# Patient Record
Sex: Male | Born: 1945 | Race: White | Hispanic: No | Smoking: Never smoker
Health system: Southern US, Community
[De-identification: ages and names within clinical notes are randomized; demographics above are authoritative.]

## PROBLEM LIST (undated history)

## (undated) DIAGNOSIS — I10 Essential (primary) hypertension: Secondary | ICD-10-CM

## (undated) HISTORY — DX: Essential (primary) hypertension: I10

## (undated) HISTORY — PX: HERNIA REPAIR: SHX51

## (undated) HISTORY — PX: TONSILLECTOMY: SUR1361

## (undated) HISTORY — PX: REPLACEMENT TOTAL KNEE: SUR1224

---

## 2005-11-10 ENCOUNTER — Ambulatory Visit: Payer: Self-pay | Admitting: Internal Medicine

## 2006-10-03 ENCOUNTER — Inpatient Hospital Stay (HOSPITAL_COMMUNITY): Admission: RE | Admit: 2006-10-03 | Discharge: 2006-10-06 | Payer: Self-pay | Admitting: Orthopedic Surgery

## 2008-07-17 ENCOUNTER — Encounter (INDEPENDENT_AMBULATORY_CARE_PROVIDER_SITE_OTHER): Payer: Self-pay | Admitting: *Deleted

## 2008-10-22 ENCOUNTER — Encounter (INDEPENDENT_AMBULATORY_CARE_PROVIDER_SITE_OTHER): Payer: Self-pay | Admitting: *Deleted

## 2009-02-13 ENCOUNTER — Emergency Department (HOSPITAL_COMMUNITY): Admission: EM | Admit: 2009-02-13 | Discharge: 2009-02-13 | Payer: Self-pay | Admitting: Emergency Medicine

## 2009-03-18 ENCOUNTER — Emergency Department (HOSPITAL_COMMUNITY): Admission: EM | Admit: 2009-03-18 | Discharge: 2009-03-18 | Payer: Self-pay | Admitting: Emergency Medicine

## 2009-08-12 ENCOUNTER — Encounter: Payer: Self-pay | Admitting: Internal Medicine

## 2009-08-26 ENCOUNTER — Encounter (INDEPENDENT_AMBULATORY_CARE_PROVIDER_SITE_OTHER): Payer: Self-pay | Admitting: *Deleted

## 2009-10-13 ENCOUNTER — Ambulatory Visit: Payer: Self-pay | Admitting: Internal Medicine

## 2009-10-13 DIAGNOSIS — N400 Enlarged prostate without lower urinary tract symptoms: Secondary | ICD-10-CM | POA: Insufficient documentation

## 2010-08-09 ENCOUNTER — Encounter: Payer: Self-pay | Admitting: Internal Medicine

## 2010-08-11 ENCOUNTER — Encounter: Payer: Self-pay | Admitting: Internal Medicine

## 2010-11-27 LAB — CONVERTED CEMR LAB
AST: 21 units/L (ref 0–37)
Alkaline Phosphatase: 78 units/L (ref 39–117)
Bilirubin, Direct: 0 mg/dL (ref 0.0–0.3)
CO2: 30 meq/L (ref 19–32)
Chloride: 103 meq/L (ref 96–112)
Eosinophils Absolute: 0.1 10*3/uL (ref 0.0–0.7)
GFR calc non Af Amer: 90.07 mL/min (ref >60)
Glucose, Bld: 78 mg/dL (ref 70–99)
HCT: 44.3 % (ref 39.0–52.0)
HDL: 54.2 mg/dL (ref >39.00)
Lymphs Abs: 1.3 10*3/uL (ref 0.7–4.0)
MCHC: 33.6 g/dL (ref 30.0–36.0)
MCV: 96.1 fL (ref 78.0–100.0)
Monocytes Absolute: 0.5 10*3/uL (ref 0.1–1.0)
Platelets: 226 10*3/uL (ref 150.0–400.0)
Potassium: 3.7 meq/L (ref 3.5–5.1)
RBC: 4.61 M/uL (ref 4.22–5.81)
Sodium: 141 meq/L (ref 135–145)
Triglycerides: 96 mg/dL (ref 0.0–149.0)
VLDL: 19.2 mg/dL (ref 0.0–40.0)
WBC: 5.5 10*3/uL (ref 4.5–10.5)

## 2010-11-30 NOTE — Miscellaneous (Signed)
Summary: Flu vacc documentation  Clinical Lists Changes  Observations: Added new observation of FLU VAX: Historical (08/09/2010 12:27)    Received notice from [facility] on [address] that patient did received flu vacc. Lucious Groves CMA  August 11, 2010 12:27 PM   Immunization History:  Influenza Immunization History:    Influenza:  historical (08/09/2010)

## 2010-11-30 NOTE — Miscellaneous (Signed)
Summary: Flu/[facility]  [facility]   Imported By: Lanelle Bal 08/18/2010 16:13:43  _____________________________________________________________________  External Attachment:    Type:   Image     Comment:   External Document

## 2011-02-07 LAB — COMPREHENSIVE METABOLIC PANEL
AST: 37 U/L (ref 0–37)
Albumin: 4.3 g/dL (ref 3.5–5.2)
Alkaline Phosphatase: 87 U/L (ref 39–117)
Calcium: 8.8 mg/dL (ref 8.4–10.5)
Creatinine, Ser: 1.24 mg/dL (ref 0.4–1.5)
GFR calc Af Amer: >60 mL/min (ref >60)
Potassium: 4.4 mEq/L (ref 3.5–5.1)
Total Protein: 6.9 g/dL (ref 6.0–8.3)

## 2011-02-07 LAB — CBC
HCT: 41.6 % (ref 39.0–52.0)
Hemoglobin: 14.7 g/dL (ref 13.0–17.0)
MCHC: 35.3 g/dL (ref 30.0–36.0)
MCV: 91.7 fL (ref 78.0–100.0)
RBC: 4.54 MIL/uL (ref 4.22–5.81)
WBC: 11.6 10*3/uL — ABNORMAL HIGH (ref 4.0–10.5)

## 2011-02-07 LAB — POCT URINALYSIS DIP (DEVICE)
Glucose, UA: NEGATIVE mg/dL
Ketones, ur: NEGATIVE mg/dL
Urobilinogen, UA: 1 mg/dL (ref 0.0–1.0)

## 2011-02-07 LAB — DIFFERENTIAL
Basophils Relative: 0 % (ref 0–1)
Eosinophils Relative: 0 % (ref 0–5)
Lymphocytes Relative: 5 % — ABNORMAL LOW (ref 12–46)
Monocytes Relative: 4 % (ref 3–12)
Neutro Abs: 10.5 10*3/uL — ABNORMAL HIGH (ref 1.7–7.7)

## 2011-02-07 LAB — LIPASE, BLOOD: Lipase: 20 U/L (ref 11–59)

## 2011-02-08 LAB — URINALYSIS, ROUTINE W REFLEX MICROSCOPIC
Glucose, UA: NEGATIVE mg/dL
Ketones, ur: >80 mg/dL — AB
Protein, ur: 30 mg/dL — AB
Specific Gravity, Urine: 1.028 (ref 1.005–1.030)
pH: 6 (ref 5.0–8.0)

## 2011-02-08 LAB — POCT I-STAT, CHEM 8
Creatinine, Ser: 1.1 mg/dL (ref 0.4–1.5)
Glucose, Bld: 106 mg/dL — ABNORMAL HIGH (ref 70–99)
HCT: 47 % (ref 39.0–52.0)
Hemoglobin: 16 g/dL (ref 13.0–17.0)
Sodium: 138 mEq/L (ref 135–145)

## 2011-02-08 LAB — CBC
Hemoglobin: 15.5 g/dL (ref 13.0–17.0)
MCHC: 34.3 g/dL (ref 30.0–36.0)
RBC: 4.87 MIL/uL (ref 4.22–5.81)

## 2011-02-08 LAB — DIFFERENTIAL
Eosinophils Absolute: 0 10*3/uL (ref 0.0–0.7)
Eosinophils Relative: 0 % (ref 0–5)
Lymphocytes Relative: 11 % — ABNORMAL LOW (ref 12–46)
Lymphs Abs: 1.2 10*3/uL (ref 0.7–4.0)
Monocytes Relative: 8 % (ref 3–12)

## 2011-03-17 NOTE — Op Note (Signed)
NAME:  Zachary Turner, PERSONS          ACCOUNT NO.:  [id number]   MEDICAL RECORD NO.:  [id number]          PATIENT TYPE:  INP   LOCATION:  NA                           [facility]ACILITY:  [facility]   PHYSICIAN:  Ollen Gross, M.D.    DATE OF BIRTH:  1945-10-31   DATE OF PROCEDURE:  10/03/2006  DATE OF DISCHARGE:                               OPERATIVE REPORT   PREOPERATIVE DIAGNOSIS:  Osteoarthritis right knee.   POSTOPERATIVE DIAGNOSIS:  Osteoarthritis right knee.   PROCEDURE:  Right total knee arthroplasty.   SURGEON:  Ollen Gross, M.D.   ASSISTANT:  Alexzandrew L. Perkins, P.A.-C.   ANESTHESIA:  Spinal.   ESTIMATED BLOOD LOSS:  Minimal.   DRAINS:  Hemovac x1.   TOURNIQUET TIME:  47 minutes at 300 mmHg.   COMPLICATIONS:  None.   CONDITION:  Stable to recovery.   BRIEF CLINICAL NOTE:  Grigor is a 65 year old male with significant end  stage medial compartment arthritis of the right knee.  He has had  previous arthroscopy, multiple injections including meniscal  supplements, and has failed nonoperative and arthroscopic management.  He has had intractable pain and presents now for total knee  arthroplasty.   PROCEDURE IN DETAIL:  After successful administration of spinal  anesthetic, a tourniquet was placed high on the right thigh and the  right lower extremity prepped and draped in the usual sterile fashion.  The extremity was wrapped in an Esmarch, knee flexed, tourniquet  inflated to 300 mmHg.  A midline incision was made with a 10 blade.  The  patient experienced pain and was subsequently converted to general via  LMA.  We then continued the procedure once the LMA was in place.  The  skin was cut with a 10 blade through the subcutaneous tissues to the  level of the extensor mechanism.  A fresh blade was used to make a  medial parapatellar arthrotomy.  Soft tissue over the proximal and  medial tibia is subperiosteally elevated to the joint line with the  knife and into the  semimembranosus bursa with a Cobb elevator.  The soft  tissue laterally is elevated with attention being paid to avoid the  patellar tendon on the tibial tubercle.  The patella was subluxed  laterally, knee flexed 90 degrees, ACL and PCL removed.  A drill was  used to create a starting hole in the distal femur, the canal was  thoroughly irrigated.  A 5 degree right valgus alignment guide is  placed, referencing off the posterior condyles, rotation is marked and  the block pinned to remove 11 mm off the distal femur.  We took 11  because of a slight flexion contracture.  Distal femoral resection is  made with an oscillating saw.  Sizing block is placed and size 5 is the  most appropriate.  The rotation is marked at the epicondylar axis and a  size 5 cutting block placed.  The anterior, posterior, and chamfer cuts  were subsequently made.   The tibia is subluxed forward and the menisci are removed.  Extramedullary tibial alignment guide is placed referencing proximally  at the medial aspect of  the tibial tubercle and distally along the  second metatarsal axis and tibial crest.  The block is pinned to remove  10 mm off the non-deficient lateral side.  Tibial resection is made with  an oscillating saw.  Size 5 is the most appropriate tibial component and  the proximal tibia is prepared with the modular drill and keel punch for  a size 4.  Femoral preparation is completed with the intercondylar cut  for the size 5.   Size 4 mobile bearing tibial trial with a size 5 posterior stabilized  femoral trial and a 10 mm posterior stabilized rotating platform insert  trial are placed.  With a 10, full extension was achieved with excellent  varus and valgus balance throughout full range of motion.  The patella  was everted, thickness measured to be 23 mm.  Freehand resection taken  to 12 mm.  The 41 template is placed, lug holes were drilled, trial  patella was placed and it tracks normally.   Osteophytes were removed off  the posterior femur with the trial placed.  All trials were removed and  the cut bone surfaces prepared with pulsatile lavage.  Cement was mixed  and once ready for implantation, the size 4 mobile bearing tibia, size 5  posterior stabilized femur and 41 patella are cemented into place and  the patella is held with a clamp.  Trial 10 mm inserts are placed and  the knee held in full extension and all extruded cement removed.  Once  the cement had fully hardened, then the permanent 10 mm posterior  stabilized rotating platform insert is placed into the tibial tray.  The  wound was copiously irrigated with saline solution and the extensor  mechanism closed over a Hemovac drain with interrupted #1 PDS.  Flexion  against gravity to 135 degrees.  The tourniquet is released with a total  time of 47 minutes.  The subcu is closed with interrupted 2-0 Vicryl and  subcuticular running 4-0 Monocryl.  The incision is cleaned and dried  and Steri-Strips and a bulky sterile dressing applied.  He is  subsequently awakened and transferred to recovery in stable condition.      Ollen Gross, M.D.  Electronically Signed     FA/MEDQ  D:  10/03/2006  T:  10/04/2006  Job:  [id number]

## 2011-03-17 NOTE — H&P (Signed)
NAME:  Zachary Turner, NORDIN NO.:  [id number]   MEDICAL RECORD NO.:  [id number]          PATIENT TYPE:  INP   LOCATION:  NA                           FACILITY:  [facility]   PHYSICIAN:  Ollen Gross, M.D.    DATE OF BIRTH:  1945-12-06   DATE OF ADMISSION:  10/03/2006  DATE OF DISCHARGE:                              HISTORY & PHYSICAL   DATE OF OFFICE VISIT/HISTORY AND PHYSICAL:  September 25, 2006.   DATE OF ADMISSION:  October 03, 2006.   CHIEF COMPLAINT:  Right knee pain.   HISTORY OF PRESENT ILLNESS:  Patient is a 65 year old male who has been  seen in consultation by Dr. Trudee Grip for ongoing right knee.  He has  been previously been treated by Dr. Hayden Rasmussen with ongoing right knee  problems.  He has known significant arthritis in the right knee with  near bone-on-bone with follow-up x-rays earlier this past month, where  it has collapsed down now to bone-on-bone in the medial compartment.  He  has had progressive symptoms in the past.  He is good friends with Dr.  Charlies Constable and was referred over.  He is seen by Dr. Despina Hick, found to  have significant arthritic changes in the medial compartment.  Due to  his pain level and his desire to continue his active lifestyle, it is  felt he would benefit from undergoing total knee replacement.  Risks and  benefits discussed.  Patient subsequently admitted to the hospital.   ALLERGIES:  No known drug allergies.   CURRENT MEDICATIONS:  Potassium gluconate, and also multivitamins.   PAST MEDICAL HISTORY:  Arthritis.   PAST SURGICAL HISTORY:  Right knee arthroscopy, osteoma surgery, wisdom  teeth extraction, tonsillectomy and adenoidectomy, and right groin  hernia repair.   SOCIAL HISTORY:  Married.  Works as an Scientist, research (medical).  Nonsmoker.  Occasional intake of alcohol.  Six children.  His wife will be assisting  with care after surgery.   FAMILY HISTORY:  Father deceased, age 49, with hypertension.  Mother  still living, age 33, with hypertension.   REVIEW OF SYSTEMS:  GENERAL:  No fevers, chills, night sweats.  NEURO:  No seizures, syncope, paralysis.  RESPIRATORY:  No shortness of breath,  productive cough, or hemoptysis.  CARDIOVASCULAR:  No chest pain,  angina, or orthopnea.  GI:  No nausea, vomiting, diarrhea, or  constipation.  GU:  No dysuria, hematuria, or discharge.  MUSCULOSKELETAL:  Right knee.   PHYSICAL EXAMINATION:  VITAL SIGNS:  Pulse 72, respirations 14, blood  pressure 128/70.  GENERAL:  A 65 year old white male who is well-developed and well-  nourished in no acute distress.  Alert, oriented and cooperative.  Very  pleasant.  An excellent historian.  HEENT:  Normocephalic and atraumatic.  Pupils are round and reactive.  Oropharynx is clear.  EOMs intact.  NECK:  Supple.  CHEST:  Clear.  HEART:  Regular rate and rhythm without murmur.  ABDOMEN:  Soft, flat, nontender.  Bowel sounds present.  RECTAL/BREASTS/GENITALIA:  Not done.  Not pertinent to the present  illness.  EXTREMITIES:  Right knee:  No effusion.  Slightly antalgic gait.  Moderate crepitus.  Range of motion is 0-130.  Tender medial joint line.   IMPRESSION:  Osteoarthritis, right knee.   PLAN:  Patient is admitted to [facility] to undergo right  total knee replacement arthroplasty.  Surgery will be performed by Dr.  Trudee Grip.      Alexzandrew L. Julien Girt, P.A.      Ollen Gross, M.D.  Electronically Signed    ALP/MEDQ  D:  10/02/2006  T:  10/03/2006  Job:  [id number]   cc:   Titus Dubin. Alwyn Ren, MD,FACP,FCCP  [phone] [place]  [place] [address]

## 2011-03-17 NOTE — Discharge Summary (Signed)
NAME:  Zachary Turner, Zachary Turner          ACCOUNT NO.:  [id number]   MEDICAL RECORD NO.:  [id number]          PATIENT TYPE:  INP   LOCATION:  1605                         FACILITY:  [facility]   PHYSICIAN:  Ollen Gross, M.D.    DATE OF BIRTH:  10/08/1946   DATE OF ADMISSION:  10/03/2006  DATE OF DISCHARGE:  10/06/2006                               DISCHARGE SUMMARY   ADMITTING DIAGNOSIS:  Osteoarthritis, right knee.   DISCHARGE DIAGNOSES:  1. Osteoarthritis, right knee, status post right total knee      arthroplasty.  2. Acute blood loss anemia, did not require transfusion.   PROCEDURE:  10/03/2006, right total knee.   SURGEON:  Ollen Gross, M.D.   ASSISTANT:  Alexzandrew L. Julien Girt, PAC.   ANESTHESIA:  Spinal.   TOURNIQUET TIME:  47 minutes.   CONSULTS:  None.   BRIEF HISTORY:  Zachary Turner is a 65 year old male with significant end-stage  medial compartment arthritis of the right knee, previous arthroscopy,  multiple injections and including meniscal supplements, failed  nonoperative management and arthroscopic management, intractable pain,  now presents for total knee.   LABORATORY DATA:  Preoperative CBC, hemoglobin 14.6, hematocrit 42.8,  white cell count 5.7.  Postoperative hemoglobin 11.5.  Last  H & H 9.3  and 26.4.  PT and PTT preoperative 13.1 and 31 respectively.  INR 1.0  __________  PT INR 25.9 and 2.2.  Chem panel on admission all within  normal limits.  __________.  Electrolytes remained within normal limits.  Glucose went up to 139 back down to 123.  Preoperative UA negative.  Blood group type A Positive.   EKG 09/26/2006 normal sinus rhythm, normal EKG, __________ admission.   HOSPITAL COURSE:  The patient admitted to [facility],  tolerated the procedure well, and later transferred to the recovery room  and then to floor.  Started on PC and p.o. analgesics for  pain control.  Given  24 hours postoperative IV antibiotics, started on Coumadin for  DVT  prophylaxis, to have a little bit of nausea on the night of surgery  and early hours, but about the next morning the nausea had improved.  He  was doing pretty well with his pain control, using his PCA a little bit,  had good urinary output, started to get up out of bed with physical  therapy.  Hemovac drain was pulled.  By day-2, he was doing absolutely  fantastic, he has already been at 90 degrees with his knee, starting to  get up with physical therapy and he was walking 200 feet, later 400 feet  that afternoon, he was doing task.  Dressing was changed on day-2 and  the incision looked excellent, and the patient was ready to go home by  the following day.   DISCHARGE PLAN:  1. The patient discharged home on 10/06/2006.  2. Discharge diagnosis, please see above.  3. Discharge medications:  Coumadin, Percocet, Robaxin.  4. Diet, as tolerated.  5. Follow up in 2 weeks.  6. Activity, weight-bearing as tolerated.  7. Home health PT, Home health nursing protocol.   DISPOSITION:  Home.   CONDITION  UPON DISCHARGE:  Improved.      Alexzandrew L. Julien Girt, P.A.      Ollen Gross, M.D.  Electronically Signed    ALP/MEDQ  D:  10/24/2006  T:  10/24/2006  Job:  [id number]   cc:   Ollen Gross, M.D.  Fax: [phone]   Titus Dubin. Alwyn Ren, MD,FACP,FCCP  [phone] [place]  [place] [address]

## 2011-08-07 ENCOUNTER — Other Ambulatory Visit: Payer: Self-pay

## 2011-08-07 MED ORDER — PNEUMOCOCCAL VAC POLYVALENT 25 MCG/0.5ML IJ INJ: 0.5000 mL | INJECTION | Freq: Once | INTRAMUSCULAR | Status: DC

## 2011-08-07 MED ORDER — ZOSTER VACCINE LIVE 19400 UNT/0.65ML ~~LOC~~ SOLR: 0.6500 mL | Freq: Once | SUBCUTANEOUS | Status: DC

## 2011-08-07 NOTE — Telephone Encounter (Signed)
Patient aware rx sent in  

## 2012-04-30 ENCOUNTER — Other Ambulatory Visit: Payer: Self-pay | Admitting: Internal Medicine

## 2012-12-17 ENCOUNTER — Encounter (HOSPITAL_COMMUNITY): Payer: Self-pay | Admitting: Emergency Medicine

## 2012-12-17 ENCOUNTER — Emergency Department (HOSPITAL_COMMUNITY)
Admission: EM | Admit: 2012-12-17 | Discharge: 2012-12-17 | Disposition: A | Discharge status: Home / Self Care | Payer: Medicare HMO | Attending: Emergency Medicine | Admitting: Emergency Medicine

## 2012-12-17 ENCOUNTER — Emergency Department (HOSPITAL_COMMUNITY): Payer: Medicare HMO

## 2012-12-17 DIAGNOSIS — Y9373 Activity, racquet and hand sports: Secondary | ICD-10-CM | POA: Insufficient documentation

## 2012-12-17 DIAGNOSIS — S43109A Unspecified dislocation of unspecified acromioclavicular joint, initial encounter: Secondary | ICD-10-CM | POA: Insufficient documentation

## 2012-12-17 DIAGNOSIS — Y92838 Other recreation area as the place of occurrence of the external cause: Secondary | ICD-10-CM | POA: Insufficient documentation

## 2012-12-17 DIAGNOSIS — Y9239 Other specified sports and athletic area as the place of occurrence of the external cause: Secondary | ICD-10-CM | POA: Insufficient documentation

## 2012-12-17 DIAGNOSIS — W010XXA Fall on same level from slipping, tripping and stumbling without subsequent striking against object, initial encounter: Secondary | ICD-10-CM | POA: Insufficient documentation

## 2012-12-17 DIAGNOSIS — S43006A Unspecified dislocation of unspecified shoulder joint, initial encounter: Secondary | ICD-10-CM

## 2012-12-17 MED ORDER — HYDROCODONE-ACETAMINOPHEN 5-325 MG PO TABS: 2.0000 | ORAL_TABLET | ORAL | Status: DC | PRN

## 2012-12-17 NOTE — ED Provider Notes (Signed)
History     CSN: [id number]  Arrival date & time 12/17/12  5621   First MD Initiated Contact with Patient 12/17/12 2101      Chief Complaint  Patient presents with  . Shoulder Injury    (Consider location/radiation/quality/duration/timing/severity/associated sxs/prior treatment) HPI Comments: Patient comes to the ER for evaluation of left shoulder injury. Patient reports that he tripped while playing tennis, fell to the ground landing on his left shoulder. Patient is complaining of pain in the shoulder since the fall. There was no head injury or loss of consciousness. No neck or back pain. Patient reports only mild pain if he sits still, but if he tries to move the arm there is severe pain.  Patient is a 67 y.o. male presenting with shoulder injury.  Shoulder Injury Pertinent negatives include no headaches.    History reviewed. No pertinent past medical history.  Past Surgical History  Procedure Laterality Date  . Hernia repair    . Replacement total knee    . Tonsillectomy      History reviewed. No pertinent family history.  History  Substance Use Topics  . Smoking status: Never Smoker   . Smokeless tobacco: Not on file  . Alcohol Use: Yes     Comment: Socially      Review of Systems  Musculoskeletal: Negative for back pain.  Neurological: Negative for headaches.    Allergies  Review of patient's allergies indicates no known allergies.  Home Medications   Current Outpatient Rx  Name  Route  Sig  Dispense  Refill  . PNEUMOVAX 23 25 MCG/0.5ML injection      INJECT 0.5 ML INTO THE MUSCLE AT ONCE   0.5 mL   0   . zoster vaccine live, PF, (ZOSTAVAX) 30865 UNT/0.65ML injection   Subcutaneous   Inject 19,400 Units into the skin once.   1 vial   0     BP 139/81  Pulse 79  Temp(Src) 98 F (36.7 C) (Oral)  Resp 18  SpO2 99%  Physical Exam  Constitutional: He is oriented to person, place, and time. He appears well-developed and well-nourished. No  distress.  HENT:  Head: Normocephalic and atraumatic.  Right Ear: Hearing normal.  Nose: Nose normal.  Mouth/Throat: Oropharynx is clear and moist and mucous membranes are normal.  Eyes: Conjunctivae and EOM are normal. Pupils are equal, round, and reactive to light.  Neck: Normal range of motion. Neck supple.  Cardiovascular: Normal rate, regular rhythm, S1 normal and S2 normal.  Exam reveals no gallop and no friction rub.   No murmur heard. Pulmonary/Chest: Effort normal and breath sounds normal. No respiratory distress. He exhibits no tenderness.  Abdominal: Soft. Normal appearance and bowel sounds are normal. There is no hepatosplenomegaly. There is no tenderness. There is no rebound, no guarding, no tenderness at McBurney's point and negative Murphy's sign. No hernia.  Musculoskeletal:       Left shoulder: He exhibits decreased range of motion, tenderness, bony tenderness, swelling and deformity.       Arms: Neurological: He is alert and oriented to person, place, and time. He has normal strength. No cranial nerve deficit or sensory deficit. Coordination normal. GCS eye subscore is 4. GCS verbal subscore is 5. GCS motor subscore is 6.  Skin: Skin is warm, dry and intact. No rash noted. No cyanosis.  Psychiatric: He has a normal mood and affect. His speech is normal and behavior is normal. Thought content normal.    ED Course  Procedures (including critical care time)  Labs Reviewed - No data to display Dg Shoulder Left  12/17/2012  *RADIOLOGY REPORT*  Clinical Data: Fall  LEFT SHOULDER - 2+ VIEW  Comparison: None.  Findings: There is near complete superior displacement of the peripheral end of the clavicle with respect to the acromion.  The coracoclavicular distance is within normal limits.  Anatomic alignment of the humeral head with respect to the glenoid.  No fracture.  Chronic changes in the superior lateral humeral head. 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  IMPRESSION: Grade  II AC joint separation.  Coracoclavicular ligament is intact.   Original Report Authenticated By: Jolaine Click, M.D.      Diagnoses: Separated shoulder    MDM  Patient presents to the ER with complaints of left shoulder pain after a fall. X-ray shows grade 2 a.c. joint separation. Patient to be treated with a sling and analgesia. Follow up with orthopedics.        Gilda Crease, MD 12/17/12 2120

## 2012-12-17 NOTE — ED Notes (Signed)
Patient complaining of left shoulder dislocation; patient reports that he was playing tennis today when he fell on his left side.  Denies hitting head or passing out upon falling down.  Shoulders notably uneven.

## 2012-12-17 NOTE — Progress Notes (Signed)
Orthopedic Tech Progress Note Patient Details:  Zachary Turner 05/02/1946 [id number]  Ortho Devices Type of Ortho Device: Shoulder immobilizer Ortho Device/Splint Location: (L) UE Ortho Device/Splint Interventions: Application   Jennye Moccasin 12/17/2012, 9:31 PM

## 2013-04-05 IMAGING — CR DG SHOULDER 2+V*L*
3 series · 3 of 3 positions shown · non-contrast
Comparison: None.

CLINICAL DATA: Fall

LEFT SHOULDER - 2+ VIEW

[w shoulder ap internal left]
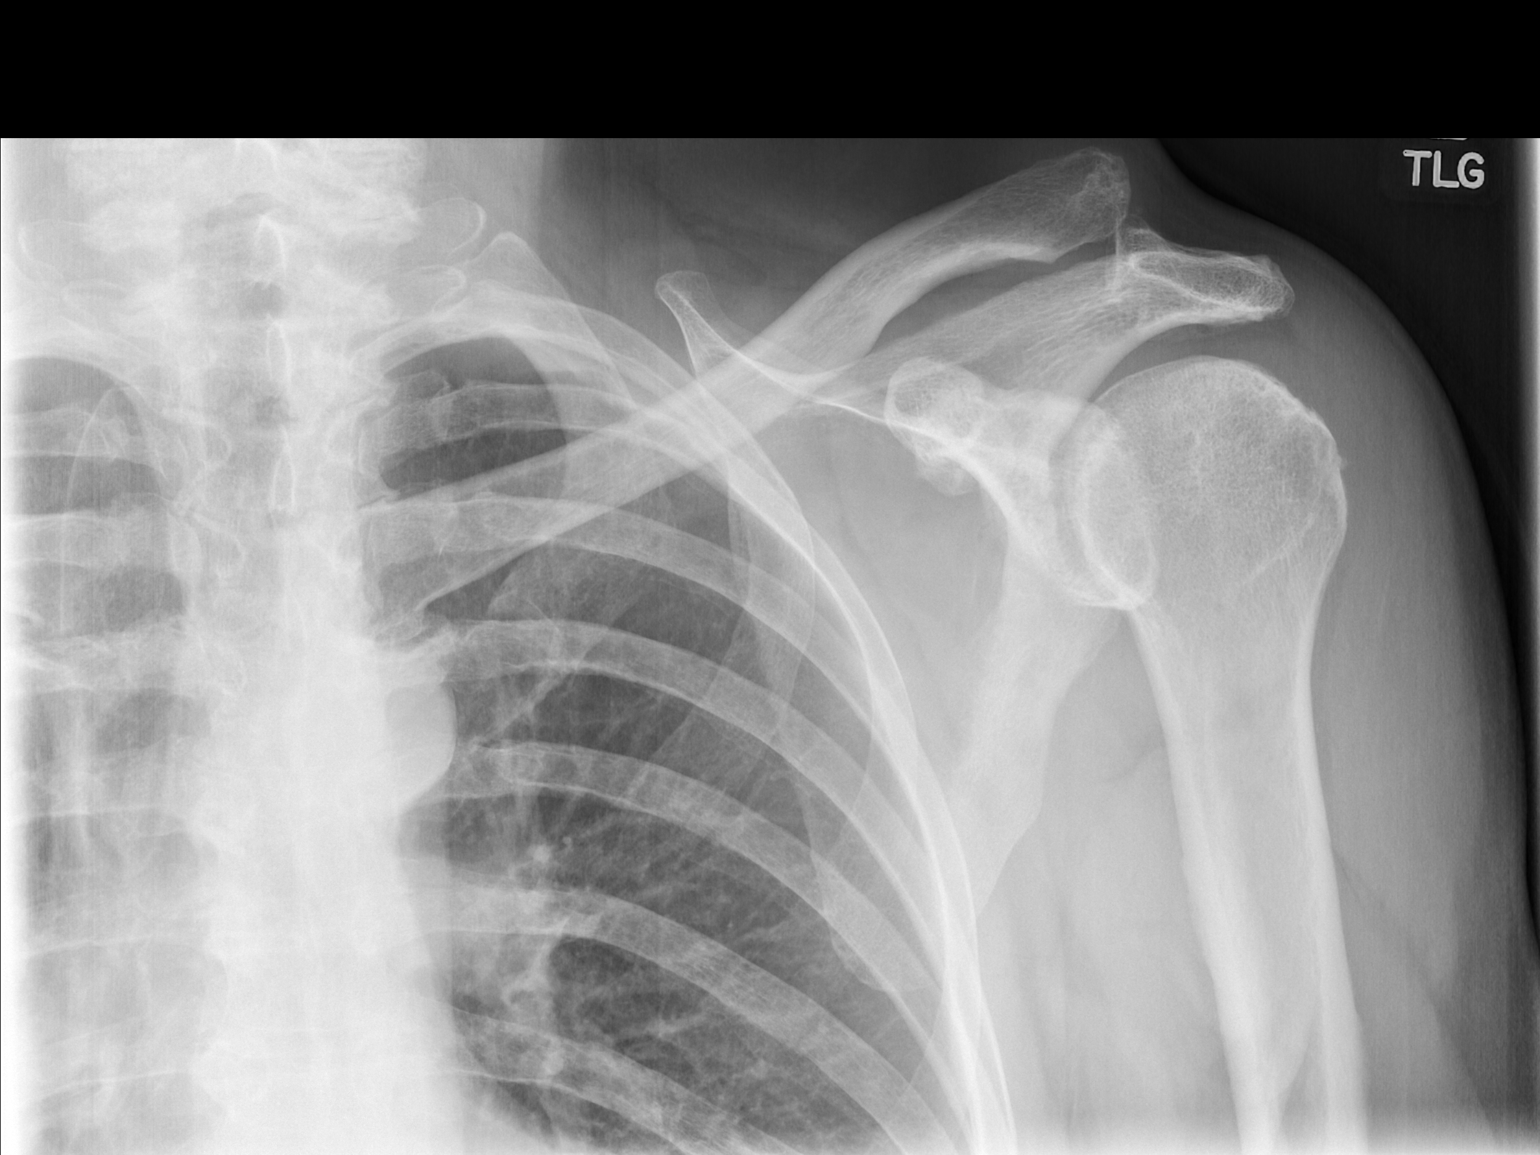

[w shoulder ap external left]
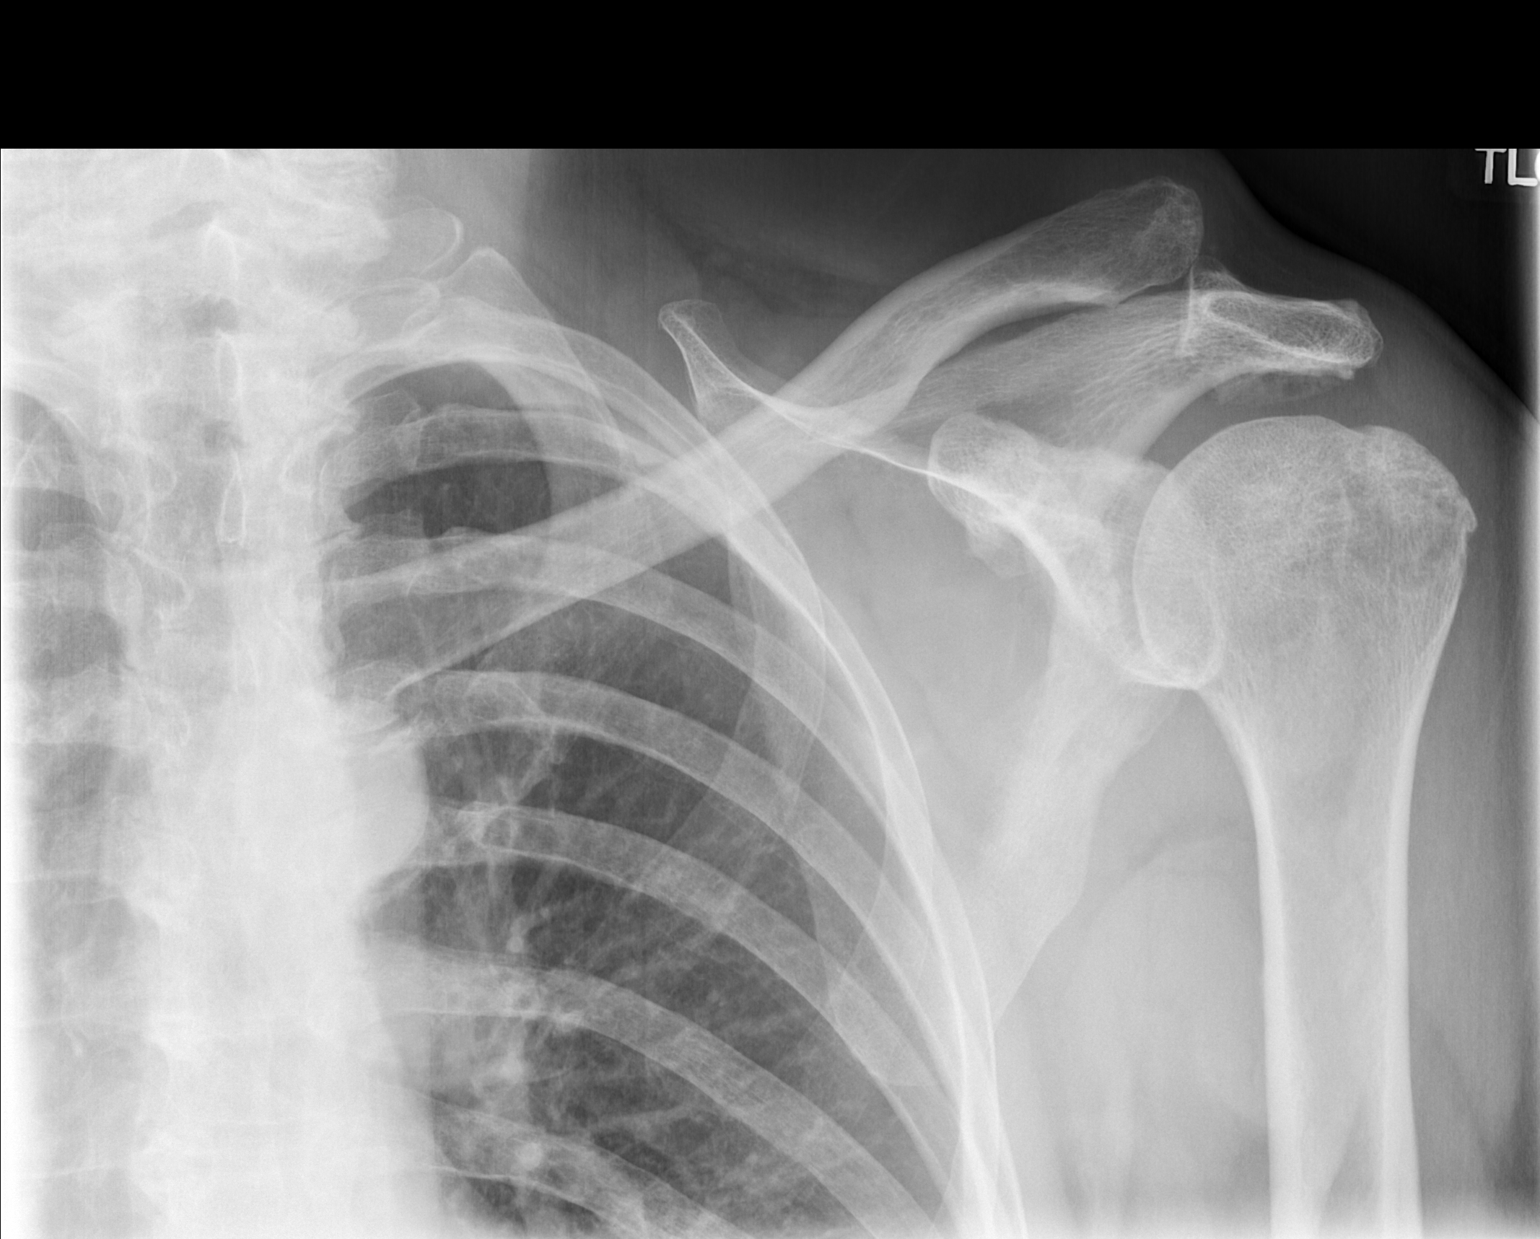

[w shoulder y view left]
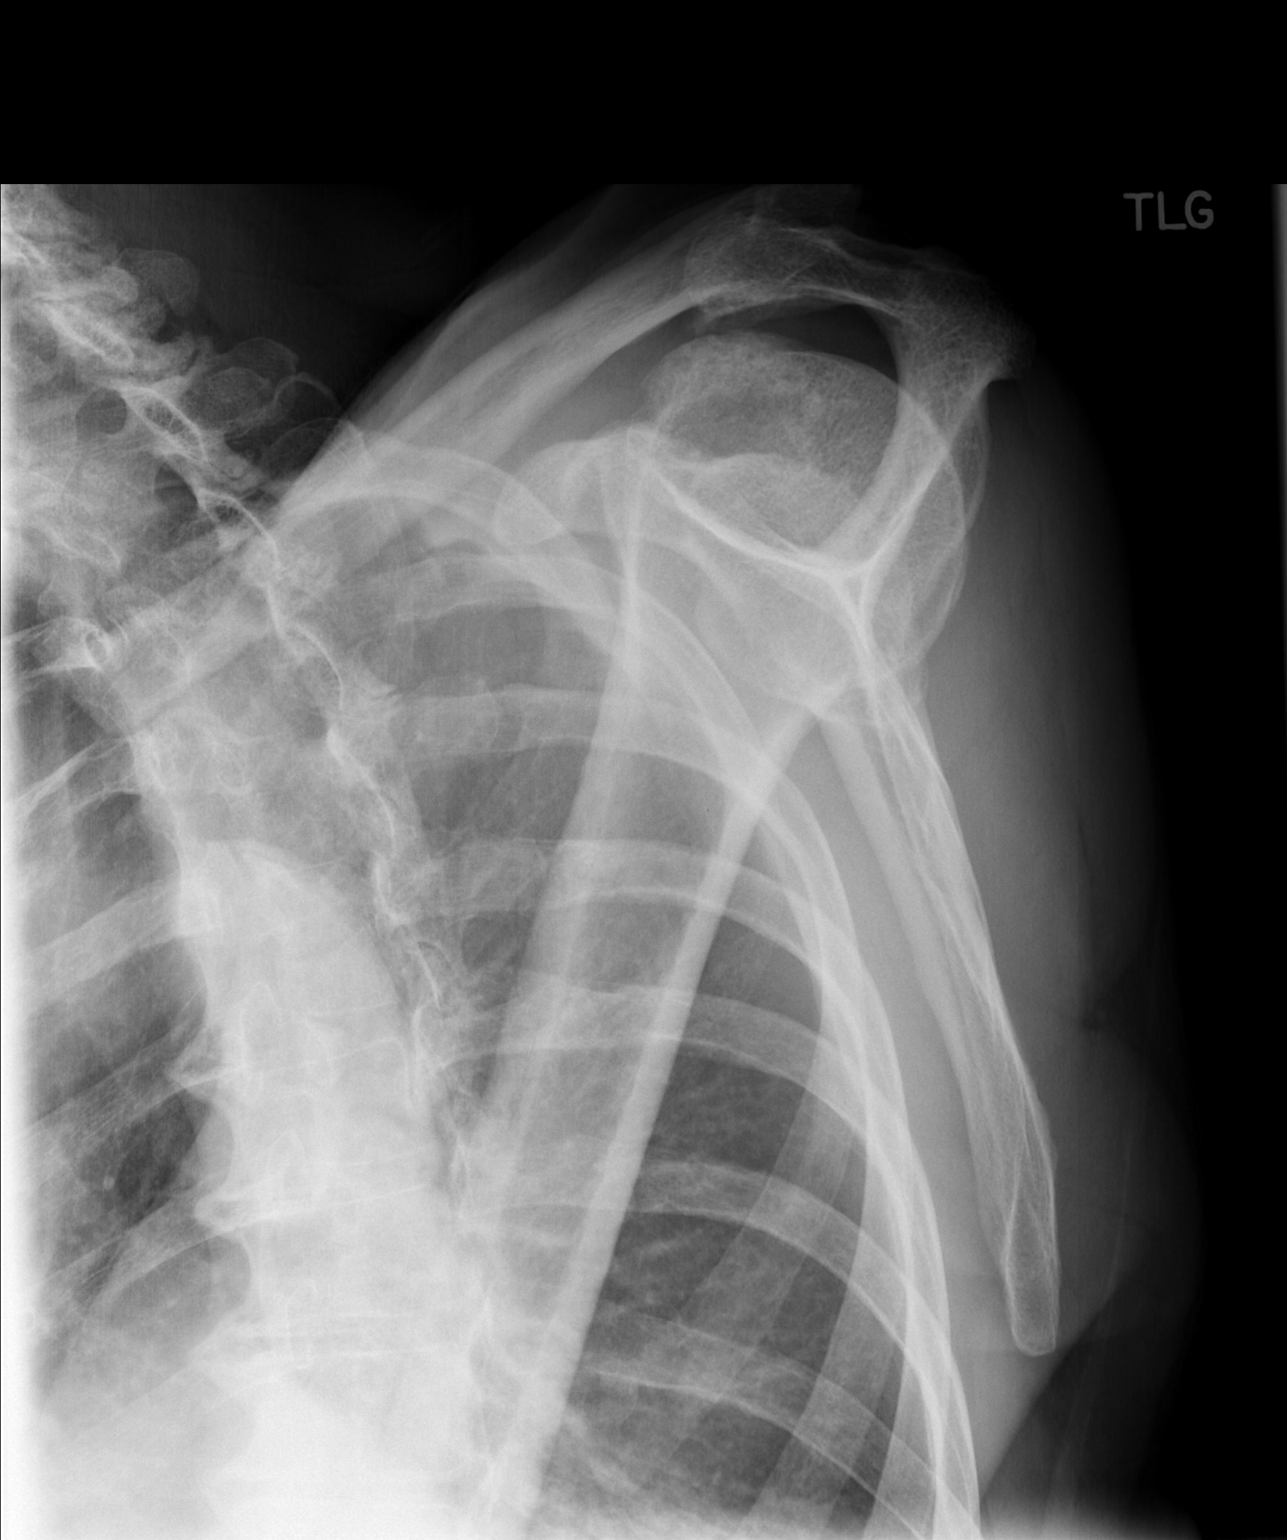

[3 of 3 positions shown; findings below may reference images not displayed]

FINDINGS: There is near complete superior displacement of the
peripheral end of the clavicle with respect to the acromion.  The
coracoclavicular distance is within normal limits.  Anatomic
alignment of the humeral head with respect to the glenoid.  No
fracture.  Chronic changes in the superior lateral humeral head.
Azygos lobe noted in the right upper lobe.
IMPRESSION: Grade II AC joint separation.  Coracoclavicular ligament is intact.

## 2016-04-04 DIAGNOSIS — L812 Freckles: Secondary | ICD-10-CM | POA: Diagnosis not present

## 2016-04-04 DIAGNOSIS — L821 Other seborrheic keratosis: Secondary | ICD-10-CM | POA: Diagnosis not present

## 2016-04-04 DIAGNOSIS — D225 Melanocytic nevi of trunk: Secondary | ICD-10-CM | POA: Diagnosis not present

## 2016-04-04 DIAGNOSIS — D485 Neoplasm of uncertain behavior of skin: Secondary | ICD-10-CM | POA: Diagnosis not present

## 2016-04-04 DIAGNOSIS — L814 Other melanin hyperpigmentation: Secondary | ICD-10-CM | POA: Diagnosis not present

## 2016-04-04 DIAGNOSIS — D1801 Hemangioma of skin and subcutaneous tissue: Secondary | ICD-10-CM | POA: Diagnosis not present

## 2016-04-04 DIAGNOSIS — L578 Other skin changes due to chronic exposure to nonionizing radiation: Secondary | ICD-10-CM | POA: Diagnosis not present

## 2016-04-04 DIAGNOSIS — Z85828 Personal history of other malignant neoplasm of skin: Secondary | ICD-10-CM | POA: Diagnosis not present

## 2016-05-11 DIAGNOSIS — L820 Inflamed seborrheic keratosis: Secondary | ICD-10-CM | POA: Diagnosis not present

## 2016-05-11 DIAGNOSIS — B353 Tinea pedis: Secondary | ICD-10-CM | POA: Diagnosis not present

## 2016-05-11 DIAGNOSIS — Z85828 Personal history of other malignant neoplasm of skin: Secondary | ICD-10-CM | POA: Diagnosis not present

## 2016-07-13 DIAGNOSIS — J0100 Acute maxillary sinusitis, unspecified: Secondary | ICD-10-CM | POA: Diagnosis not present

## 2016-07-31 DIAGNOSIS — R69 Illness, unspecified: Secondary | ICD-10-CM | POA: Diagnosis not present

## 2016-10-12 DIAGNOSIS — L812 Freckles: Secondary | ICD-10-CM | POA: Diagnosis not present

## 2016-10-12 DIAGNOSIS — L853 Xerosis cutis: Secondary | ICD-10-CM | POA: Diagnosis not present

## 2016-10-12 DIAGNOSIS — Z85828 Personal history of other malignant neoplasm of skin: Secondary | ICD-10-CM | POA: Diagnosis not present

## 2016-10-12 DIAGNOSIS — D1801 Hemangioma of skin and subcutaneous tissue: Secondary | ICD-10-CM | POA: Diagnosis not present

## 2016-10-12 DIAGNOSIS — D225 Melanocytic nevi of trunk: Secondary | ICD-10-CM | POA: Diagnosis not present

## 2016-10-12 DIAGNOSIS — L821 Other seborrheic keratosis: Secondary | ICD-10-CM | POA: Diagnosis not present

## 2017-02-08 DIAGNOSIS — B078 Other viral warts: Secondary | ICD-10-CM | POA: Diagnosis not present

## 2017-02-08 DIAGNOSIS — Z85828 Personal history of other malignant neoplasm of skin: Secondary | ICD-10-CM | POA: Diagnosis not present

## 2017-04-24 DIAGNOSIS — L738 Other specified follicular disorders: Secondary | ICD-10-CM | POA: Diagnosis not present

## 2017-04-24 DIAGNOSIS — Z85828 Personal history of other malignant neoplasm of skin: Secondary | ICD-10-CM | POA: Diagnosis not present

## 2017-04-24 DIAGNOSIS — D225 Melanocytic nevi of trunk: Secondary | ICD-10-CM | POA: Diagnosis not present

## 2017-04-24 DIAGNOSIS — L812 Freckles: Secondary | ICD-10-CM | POA: Diagnosis not present

## 2017-04-24 DIAGNOSIS — D2261 Melanocytic nevi of right upper limb, including shoulder: Secondary | ICD-10-CM | POA: Diagnosis not present

## 2017-04-24 DIAGNOSIS — D1801 Hemangioma of skin and subcutaneous tissue: Secondary | ICD-10-CM | POA: Diagnosis not present

## 2017-04-24 DIAGNOSIS — L239 Allergic contact dermatitis, unspecified cause: Secondary | ICD-10-CM | POA: Diagnosis not present

## 2017-04-24 DIAGNOSIS — B353 Tinea pedis: Secondary | ICD-10-CM | POA: Diagnosis not present

## 2017-04-24 DIAGNOSIS — D2271 Melanocytic nevi of right lower limb, including hip: Secondary | ICD-10-CM | POA: Diagnosis not present

## 2017-04-24 DIAGNOSIS — L821 Other seborrheic keratosis: Secondary | ICD-10-CM | POA: Diagnosis not present

## 2017-07-26 DIAGNOSIS — R69 Illness, unspecified: Secondary | ICD-10-CM | POA: Diagnosis not present

## 2017-10-09 DIAGNOSIS — C44612 Basal cell carcinoma of skin of right upper limb, including shoulder: Secondary | ICD-10-CM | POA: Diagnosis not present

## 2017-10-09 DIAGNOSIS — L812 Freckles: Secondary | ICD-10-CM | POA: Diagnosis not present

## 2017-10-09 DIAGNOSIS — D485 Neoplasm of uncertain behavior of skin: Secondary | ICD-10-CM | POA: Diagnosis not present

## 2017-10-09 DIAGNOSIS — L821 Other seborrheic keratosis: Secondary | ICD-10-CM | POA: Diagnosis not present

## 2017-10-09 DIAGNOSIS — D1801 Hemangioma of skin and subcutaneous tissue: Secondary | ICD-10-CM | POA: Diagnosis not present

## 2017-10-09 DIAGNOSIS — Z85828 Personal history of other malignant neoplasm of skin: Secondary | ICD-10-CM | POA: Diagnosis not present

## 2017-11-02 DIAGNOSIS — Z85828 Personal history of other malignant neoplasm of skin: Secondary | ICD-10-CM | POA: Diagnosis not present

## 2017-11-02 DIAGNOSIS — L821 Other seborrheic keratosis: Secondary | ICD-10-CM | POA: Diagnosis not present

## 2017-11-15 ENCOUNTER — Ambulatory Visit: Payer: Medicare HMO | Admitting: Sports Medicine

## 2017-11-15 ENCOUNTER — Ambulatory Visit: Payer: Self-pay

## 2017-11-15 ENCOUNTER — Encounter: Payer: Self-pay | Admitting: Sports Medicine

## 2017-11-15 VITALS — BP 181/93 | Ht 72.0 in | Wt 167.0 lb

## 2017-11-15 DIAGNOSIS — M25512 Pain in left shoulder: Secondary | ICD-10-CM

## 2017-11-15 DIAGNOSIS — M19012 Primary osteoarthritis, left shoulder: Secondary | ICD-10-CM | POA: Diagnosis not present

## 2017-11-15 DIAGNOSIS — M19011 Primary osteoarthritis, right shoulder: Secondary | ICD-10-CM | POA: Insufficient documentation

## 2017-11-15 DIAGNOSIS — M75112 Incomplete rotator cuff tear or rupture of left shoulder, not specified as traumatic: Secondary | ICD-10-CM | POA: Insufficient documentation

## 2017-11-15 MED ORDER — NITROGLYCERIN 0.2 MG/HR TD PT24: MEDICATED_PATCH | TRANSDERMAL | 0 refills | Status: DC
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
## 2017-11-15 NOTE — Assessment & Plan Note (Addendum)
Patient here w/ sxs c/w left sided RC tear. [place] gave evidence of GH irregularity and supraspinatous tear/calcification. 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  - HEP given - Nitro protocol - F/u 6 weeks

## 2017-11-15 NOTE — Patient Instructions (Signed)
Nitroglycerin Protocol   Apply 1/4 nitroglycerin patch to affected area daily.  Change position of patch within the affected area every 24 hours.  You may experience a headache during the first 1-2 weeks of using the patch, these should subside.  If you experience headaches after beginning nitroglycerin patch treatment, you may take your preferred over the counter pain reliever.  Another side effect of the nitroglycerin patch is skin irritation or rash related to patch adhesive.  Please notify our office if you develop more severe headaches or rash, and stop the patch.  Tendon healing with nitroglycerin patch may require 12 to 24 weeks depending on the extent of injury.  Men should not use if taking Viagra, Cialis, or Levitra.   Do not use if you have migraines or rosacea.   Exercises:  -Perform the physical therapy exercises provided today, with both shoulders, every day over the next 6 weeks. -In addition, for only your left arm perform light, low velocity and controlled (1) forehand, (2) back hand, and (3) serving motions with a 2-3 pound weight.  15 x 3.

## 2017-11-15 NOTE — Assessment & Plan Note (Signed)
As above.

## 2017-11-22 ENCOUNTER — Ambulatory Visit: Payer: Medicare HMO | Admitting: Sports Medicine

## 2017-12-26 ENCOUNTER — Ambulatory Visit: Payer: Self-pay

## 2017-12-26 ENCOUNTER — Encounter: Payer: Self-pay | Admitting: Sports Medicine

## 2017-12-26 ENCOUNTER — Ambulatory Visit: Payer: Medicare HMO | Admitting: Sports Medicine

## 2017-12-26 VITALS — BP 140/84 | Ht 71.5 in | Wt 167.0 lb

## 2017-12-26 DIAGNOSIS — M19012 Primary osteoarthritis, left shoulder: Secondary | ICD-10-CM

## 2017-12-26 DIAGNOSIS — M75112 Incomplete rotator cuff tear or rupture of left shoulder, not specified as traumatic: Secondary | ICD-10-CM

## 2017-12-26 DIAGNOSIS — M7521 Bicipital tendinitis, right shoulder: Secondary | ICD-10-CM | POA: Diagnosis not present

## 2017-12-26 DIAGNOSIS — M19011 Primary osteoarthritis, right shoulder: Secondary | ICD-10-CM | POA: Diagnosis not present

## 2017-12-26 NOTE — Assessment & Plan Note (Signed)
Continue to work ROM Keep up RC exercises PRN ibuprofen

## 2017-12-26 NOTE — Assessment & Plan Note (Signed)
Add biceps curls and forearm rolls  Try NTG protocol
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
## 2017-12-26 NOTE — Assessment & Plan Note (Signed)
Clinically much improved Appearance on [place] is improved as well  Cont spokes of wheel exercises NTG protocol for another 2 mos Reck 2 mos

## 2017-12-27 ENCOUNTER — Ambulatory Visit: Payer: Medicare HMO | Admitting: Sports Medicine

## 2018-01-14 DIAGNOSIS — R05 Cough: Secondary | ICD-10-CM | POA: Diagnosis not present

## 2018-01-14 DIAGNOSIS — J00 Acute nasopharyngitis [common cold]: Secondary | ICD-10-CM | POA: Diagnosis not present

## 2018-01-25 ENCOUNTER — Other Ambulatory Visit: Payer: Self-pay | Admitting: *Deleted

## 2018-01-25 MED ORDER — NITROGLYCERIN 0.2 MG/HR TD PT24: MEDICATED_PATCH | TRANSDERMAL | 0 refills | Status: DC

## 2018-02-21 ENCOUNTER — Other Ambulatory Visit: Payer: Self-pay

## 2018-02-21 MED ORDER — NITROGLYCERIN 0.2 MG/HR TD PT24: MEDICATED_PATCH | TRANSDERMAL | 0 refills | Status: DC

## 2018-03-05 ENCOUNTER — Ambulatory Visit: Payer: Medicare HMO | Admitting: Sports Medicine

## 2018-03-26 ENCOUNTER — Ambulatory Visit: Payer: Medicare HMO | Admitting: Sports Medicine

## 2018-03-26 ENCOUNTER — Encounter: Payer: Self-pay | Admitting: Sports Medicine

## 2018-03-26 VITALS — BP 148/88 | Ht 71.5 in | Wt 168.0 lb

## 2018-03-26 DIAGNOSIS — M25511 Pain in right shoulder: Secondary | ICD-10-CM

## 2018-03-26 DIAGNOSIS — M25512 Pain in left shoulder: Secondary | ICD-10-CM | POA: Diagnosis not present

## 2018-03-26 DIAGNOSIS — G8929 Other chronic pain: Secondary | ICD-10-CM | POA: Diagnosis not present

## 2018-03-26 MED ORDER — NITROGLYCERIN 0.2 MG/HR TD PT24: MEDICATED_PATCH | TRANSDERMAL | 1 refills | Status: DC
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
## 2018-04-12 DIAGNOSIS — D225 Melanocytic nevi of trunk: Secondary | ICD-10-CM | POA: Diagnosis not present

## 2018-04-12 DIAGNOSIS — L812 Freckles: Secondary | ICD-10-CM | POA: Diagnosis not present

## 2018-04-12 DIAGNOSIS — L821 Other seborrheic keratosis: Secondary | ICD-10-CM | POA: Diagnosis not present

## 2018-04-12 DIAGNOSIS — Z85828 Personal history of other malignant neoplasm of skin: Secondary | ICD-10-CM | POA: Diagnosis not present

## 2018-04-12 DIAGNOSIS — L304 Erythema intertrigo: Secondary | ICD-10-CM | POA: Diagnosis not present

## 2018-04-12 DIAGNOSIS — D485 Neoplasm of uncertain behavior of skin: Secondary | ICD-10-CM | POA: Diagnosis not present

## 2018-04-24 ENCOUNTER — Other Ambulatory Visit: Payer: Self-pay | Admitting: *Deleted

## 2018-04-24 MED ORDER — NITROGLYCERIN 0.2 MG/HR TD PT24: MEDICATED_PATCH | TRANSDERMAL | 1 refills | Status: DC

## 2018-05-17 ENCOUNTER — Other Ambulatory Visit: Payer: Self-pay | Admitting: *Deleted

## 2018-07-22 DIAGNOSIS — R69 Illness, unspecified: Secondary | ICD-10-CM | POA: Diagnosis not present

## 2018-10-11 DIAGNOSIS — L812 Freckles: Secondary | ICD-10-CM | POA: Diagnosis not present

## 2018-10-11 DIAGNOSIS — D225 Melanocytic nevi of trunk: Secondary | ICD-10-CM | POA: Diagnosis not present

## 2018-10-11 DIAGNOSIS — L570 Actinic keratosis: Secondary | ICD-10-CM | POA: Diagnosis not present

## 2018-10-11 DIAGNOSIS — Z85828 Personal history of other malignant neoplasm of skin: Secondary | ICD-10-CM | POA: Diagnosis not present

## 2018-10-11 DIAGNOSIS — L821 Other seborrheic keratosis: Secondary | ICD-10-CM | POA: Diagnosis not present

## 2018-10-11 DIAGNOSIS — L738 Other specified follicular disorders: Secondary | ICD-10-CM | POA: Diagnosis not present

## 2018-11-04 DIAGNOSIS — J300 Vasomotor rhinitis: Secondary | ICD-10-CM | POA: Diagnosis not present

## 2018-11-04 DIAGNOSIS — J329 Chronic sinusitis, unspecified: Secondary | ICD-10-CM | POA: Diagnosis not present

## 2019-01-16 ENCOUNTER — Telehealth: Payer: Self-pay | Admitting: Internal Medicine

## 2019-01-16 NOTE — Telephone Encounter (Signed)
Copied from [place] [id number]. Topic: General - Other >> Jan 16, 2019 12:28 PM Yvette Rack wrote: Reason for CRM: Pt wife Jayson Waterhouse is a patient of Dr. Quay Burow and she is requesting that Dr. Quay Burow agrees to become her husband's pcp. Pt wife requests call back. Cb# [phone]

## 2019-01-16 NOTE — Telephone Encounter (Signed)
Yes, I will accept him

## 2019-01-27 NOTE — Telephone Encounter (Signed)
LVM for patient tot call back and set appointment up in June. Call office for questions or concerns

## 2019-01-27 NOTE — Telephone Encounter (Signed)
Patient is scheduled for 5/1.  Patient is concerned about BP.  States it has been running around 145/90.  States he believes he will be fine until 5/1.  Denies having blurred/ spotty vision and headaches.  I did advise patient to call the office if things change.

## 2019-01-27 NOTE — Telephone Encounter (Signed)
Patient calling back for [facility] regarding voicemail left regarding scheduling an appointment with Dr. Quay Burow. Would like to schedule with her In June.

## 2019-01-27 NOTE — Telephone Encounter (Signed)
Wife called in and left message on vm , she would like a call back about getting pt in for a new pt appt.  Per TE Dr burns ok'd to take him on.  Please advise   [phone]

## 2019-01-27 NOTE — Telephone Encounter (Signed)
Pt returning Samantha's Vm regarding scheduling appt. Please advise.

## 2019-02-25 ENCOUNTER — Telehealth: Payer: Self-pay

## 2019-02-25 NOTE — Telephone Encounter (Signed)
Ok to schedule new pt virtual visit - we will discuss his BP -- in July we can do a CPE

## 2019-02-25 NOTE — Progress Notes (Signed)
Virtual Visit via Video Note  I connected with Zachary Turner on 02/26/19 at  9:30 AM EDT by a video enabled telemedicine application and verified that I am speaking with the correct person using two identifiers.   I discussed the limitations of evaluation and management by telemedicine and the availability of in person appointments. The patient expressed understanding and agreed to proceed.  The patient is currently at home and I am in the office.    No referring provider.    History of Present Illness: This visit is for him to establish care.  We planned on having him come in july, but he has been concerned about his BP being high and we needed to arrange for an acute visit.    BP at home over the past few days has ranged 140/80-160/95.  When he was younger his BP was 135/80 and has creeped up over the years.  He does not have a diagnosis of hypertension and is not on any medication for it.  Both of his parents had hypertension and was on medication.  He exercises daily and is not overweight.  He has a healthy diet, but it is not low is salt.  He drinks a lot of water during the day.    He denies any concerning symptoms.  He was concerned that he may need medication to lower his blood pressure.  He has no other concerns.  He is not currently taking any prescription medication.   Review of Systems  Constitutional: Negative for chills and fever.  Respiratory: Negative for cough, shortness of breath and wheezing.   Cardiovascular: Negative for chest pain, palpitations and leg swelling.  Neurological: Negative for dizziness and headaches.   Medications reviewed.  History reviewed and updated.  Patient Active Problem List   Diagnosis Date Noted  . Bicipital tendonitis of right shoulder 12/26/2017  . Incomplete tear of left rotator cuff 11/15/2017  . Arthritis of both glenohumeral joints 11/15/2017  . HYPERPLASIA PROSTATE UNS W/O UR OBST & OTH LUTS 10/13/2009   Allergies as of  02/26/2019   No Known Allergies     Medication List       Accurate as of February 26, 2019 10:17 AM. Always use your most recent med list.        ibuprofen 200 MG tablet Commonly known as:  ADVIL Take 400 mg by mouth 2 (two) times daily as needed for pain.   MAGNESIUM CITRATE PO Take 1 tablet by mouth daily.   multivitamin with minerals Tabs tablet Take 1 tablet by mouth daily. Spectracite   nebivolol 5 MG tablet Commonly known as:  Bystolic Take 1 tablet (5 mg total) by mouth daily.   POTASSIUM GLUCONATE PO Take 1 tablet by mouth 2 (two) times daily.   tretinoin 0.05 % cream Commonly known as:  RETIN-A APPLY TO FACE EVERY NIGHT -- PER MD NOT COVD      History reviewed. No pertinent past medical history.   Past Surgical History:  Procedure Laterality Date  . HERNIA REPAIR    . REPLACEMENT TOTAL KNEE    . TONSILLECTOMY     Family History  Problem Relation Age of Onset  . Hypertension Mother   . Hypertension Father   . Obesity Sister   . Heart disease Brother   . Obesity Sister     Social History   Socioeconomic History  . Marital status: Married    Spouse name: Not on file  . Number of children: Not  on file  . Years of education: Not on file  . Highest education level: Not on file  Occupational History  . Not on file  Social Needs  . Financial resource strain: Not on file  . Food insecurity:    Worry: Not on file    Inability: Not on file  . Transportation needs:    Medical: Not on file    Non-medical: Not on file  Tobacco Use  . Smoking status: Never Smoker  . Smokeless tobacco: Never Used  Substance and Sexual Activity  . Alcohol use: Yes    Comment: Socially  . Drug use: No  . Sexual activity: Not on file  Lifestyle  . Physical activity:    Days per week: Not on file    Minutes per session: Not on file  . Stress: Not on file  Relationships  . Social connections:    Talks on phone: Not on file    Gets together: Not on file    Attends  religious service: Not on file    Active member of club or organization: Not on file    Attends meetings of clubs or organizations: Not on file    Relationship status: Not on file  Other Topics Concern  . Not on file  Social History Narrative  . Not on file     Observations/Objective: Appears well in NAD  Blood pressure at home over the past few days 140/80-160/95  Assessment and Plan:  See Problem List for Assessment and Plan of chronic medical problems.   Follow Up Instructions:    I discussed the assessment and treatment plan with the patient. The patient was provided an opportunity to ask questions and all were answered. The patient agreed with the plan and demonstrated an understanding of the instructions.   The patient was advised to call back or seek an in-person evaluation if the symptoms worsen or if the condition fails to improve as anticipated.  FU 7/1 for CPE  Binnie Rail, MD

## 2019-02-25 NOTE — Telephone Encounter (Signed)
Ca n you schedule this please?

## 2019-02-25 NOTE — Telephone Encounter (Signed)
Patient is supposed to establish with you in July now. The appointment was moved from Friday. He had a concern about his elevated blood pressure. Since he is new and you have not seen him before, I did not know if this could be turned into a virtual visit or not. Please advise.

## 2019-02-25 NOTE — Telephone Encounter (Signed)
Apot scheduled for tomorrow

## 2019-02-26 ENCOUNTER — Encounter: Payer: Self-pay | Admitting: Internal Medicine

## 2019-02-26 ENCOUNTER — Ambulatory Visit (INDEPENDENT_AMBULATORY_CARE_PROVIDER_SITE_OTHER): Payer: Medicare HMO | Admitting: Internal Medicine

## 2019-02-26 DIAGNOSIS — I10 Essential (primary) hypertension: Secondary | ICD-10-CM

## 2019-02-26 MED ORDER — NEBIVOLOL HCL 5 MG PO TABS: 5.0000 mg | ORAL_TABLET | Freq: Every day | ORAL | 5 refills | Status: DC

## 2019-02-26 NOTE — Assessment & Plan Note (Signed)
This is a new diagnosis He understands that uncontrolled hypertension can cause severe consequences He agrees he most likely needs medication We will start Bystolic 5 mg daily-he will let me know if this is too expensive or if he has any side effects He will continue to monitor blood pressure at home-discussed goal of less than 140/90 He does consume a lot of salt, which could be influencing his blood pressure, but he also has a strong genetic history and his blood pressure is always been borderline high He will continue regular exercise He will come in July for a physical and we will do blood work at that time He will call sooner with any questions or concerns

## 2019-02-27 NOTE — Telephone Encounter (Signed)
Pt is concerned the medication for his BP may not go well with his diet, specifically grapefruit juice. Please advise.

## 2019-02-27 NOTE — Telephone Encounter (Signed)
Ok to stay on bystolic -- it may not be as effective while drinking grapefruit juice but I think he should try and monitor his BP and we can see how well it works.

## 2019-02-27 NOTE — Telephone Encounter (Signed)
We can do something different  - we can try lisinopril 10 mg daily - typically well controlled - side effect if any is a dry cough.  We could also try amlodipine 5 mg daily - side effect if any is ankle swelling.

## 2019-02-27 NOTE — Telephone Encounter (Signed)
Patient just wanted to know if he can take the bystolic with grapefruit juice. Heard that it did not go well will some BP meds. Would really like to stay on bystolic.

## 2019-02-28 ENCOUNTER — Ambulatory Visit: Payer: Medicare HMO | Admitting: Internal Medicine

## 2019-02-28 NOTE — Telephone Encounter (Signed)
Pt aware of response.  

## 2019-03-06 DIAGNOSIS — L438 Other lichen planus: Secondary | ICD-10-CM | POA: Diagnosis not present

## 2019-03-06 DIAGNOSIS — L812 Freckles: Secondary | ICD-10-CM | POA: Diagnosis not present

## 2019-03-06 DIAGNOSIS — D1801 Hemangioma of skin and subcutaneous tissue: Secondary | ICD-10-CM | POA: Diagnosis not present

## 2019-03-06 DIAGNOSIS — Z85828 Personal history of other malignant neoplasm of skin: Secondary | ICD-10-CM | POA: Diagnosis not present

## 2019-03-06 DIAGNOSIS — L570 Actinic keratosis: Secondary | ICD-10-CM | POA: Diagnosis not present

## 2019-03-06 DIAGNOSIS — L821 Other seborrheic keratosis: Secondary | ICD-10-CM | POA: Diagnosis not present

## 2019-04-30 ENCOUNTER — Encounter: Payer: Medicare HMO | Admitting: Internal Medicine

## 2019-05-14 NOTE — Progress Notes (Signed)
Subjective:    Patient ID: Zachary Turner, male    DOB: September 27, 1946, 73 y.o.   MRN: [id number]  HPI He is here for a physical exam.   His BP has been good at home - 125/75, 135/80.     He exercises regularly.  He has leg cramps ( usually at night) and takes salt pill when he has the cramping and it works.  He takes two potassium pills, magnesium citrate during the day.  He drinks ice tea in the morning, coke in the afternoon.      He tends to eat one meal a day.  He snacks during the day - chips.     Medications and allergies reviewed with patient and updated if appropriate.  Patient Active Problem List   Diagnosis Date Noted  . S/P total knee arthroplasty, right 05/15/2019  . Essential hypertension 02/26/2019  . Bicipital tendonitis of right shoulder 12/26/2017  . Incomplete tear of left rotator cuff 11/15/2017  . Arthritis of both glenohumeral joints 11/15/2017  . HYPERPLASIA PROSTATE UNS W/O UR OBST & OTH LUTS 10/13/2009    Current Outpatient Medications on File Prior to Visit  Medication Sig Dispense Refill  . ibuprofen (ADVIL,MOTRIN) 200 MG tablet Take 400 mg by mouth 2 (two) times daily as needed for pain.    Marland Kitchen MAGNESIUM CITRATE PO Take 1 tablet by mouth daily.    . Multiple Vitamin (MULTIVITAMIN WITH MINERALS) TABS Take 1 tablet by mouth daily. Spectracite    . nebivolol (BYSTOLIC) 5 MG tablet Take 1 tablet (5 mg total) by mouth daily. 30 tablet 5  . POTASSIUM GLUCONATE PO Take 1 tablet by mouth 2 (two) times daily.    Marland Kitchen tretinoin (RETIN-A) 0.05 % cream APPLY TO FACE EVERY NIGHT -- PER MD NOT COVD     No current facility-administered medications on file prior to visit.     History reviewed. No pertinent past medical history.  Past Surgical History:  Procedure Laterality Date  . HERNIA REPAIR    . REPLACEMENT TOTAL KNEE    . TONSILLECTOMY      Social History   Socioeconomic History  . Marital status: Married    Spouse name: Not on file  . Number of  children: Not on file  . Years of education: Not on file  . Highest education level: Not on file  Occupational History  . Not on file  Social Needs  . Financial resource strain: Not on file  . Food insecurity    Worry: Not on file    Inability: Not on file  . Transportation needs    Medical: Not on file    Non-medical: Not on file  Tobacco Use  . Smoking status: Never Smoker  . Smokeless tobacco: Never Used  Substance and Sexual Activity  . Alcohol use: Yes    Comment: Socially  . Drug use: No  . Sexual activity: Not on file  Lifestyle  . Physical activity    Days per week: Not on file    Minutes per session: Not on file  . Stress: Not on file  Relationships  . Social Herbalist on phone: Not on file    Gets together: Not on file    Attends religious service: Not on file    Active member of club or organization: Not on file    Attends meetings of clubs or organizations: Not on file    Relationship status: Not on file  Other  Topics Concern  . Not on file  Social History Narrative  . Not on file    Family History  Problem Relation Age of Onset  . Hypertension Mother   . Hypertension Father   . Obesity Sister   . Atrial fibrillation Sister   . Heart disease Brother   . Obesity Sister     Review of Systems  Constitutional: Negative for chills and fever.  Eyes: Negative for visual disturbance.  Respiratory: Negative for cough, shortness of breath and wheezing.   Cardiovascular: Negative for chest pain, palpitations and leg swelling.  Gastrointestinal: Negative for abdominal pain, blood in stool, constipation, diarrhea and nausea.       No gerd  Genitourinary: Negative for difficulty urinating, dysuria and hematuria.  Musculoskeletal: Positive for arthralgias (shoulders) and back pain (stiffness at times).       Leg cramping at night  Skin: Negative for color change and rash.  Neurological: Positive for headaches (occ). Negative for dizziness and  light-headedness.  Psychiatric/Behavioral: Negative for dysphoric mood. The patient is not nervous/anxious.        Objective:   Vitals:   05/15/19 0822  Pulse: (!) 57  Resp: 16  Temp: 97.8 F (36.6 C)  SpO2: 98%   Filed Weights   05/15/19 0822  Weight: 168 lb (76.2 kg)   Body mass index is 23.1 kg/m.  Wt Readings from Last 3 Encounters:  05/15/19 168 lb (76.2 kg)  03/26/18 168 lb (76.2 kg)  12/26/17 167 lb (75.8 kg)     Physical Exam Constitutional: He appears well-developed and well-nourished. No distress.  HENT:  Head: Normocephalic and atraumatic.  Right Ear: External ear normal.  Left Ear: External ear normal.  Mouth/Throat: Oropharynx is clear and moist.  Normal ear canals and TM b/l  Eyes: Conjunctivae and EOM are normal.  Neck: Neck supple. No tracheal deviation present. No thyromegaly present.  No carotid bruit  Cardiovascular: Normal rate, regular rhythm, normal heart sounds and intact distal pulses.   No murmur heard. Pulmonary/Chest: Effort normal and breath sounds normal. No respiratory distress. He has no wheezes. He has no rales.  Abdominal: Soft. He exhibits no distension. There is no tenderness.  Genitourinary: deferred  Musculoskeletal: He exhibits no edema.  Lymphadenopathy:   He has no cervical adenopathy.  Skin: Skin is warm and dry. He is not diaphoretic.  Psychiatric: He has a normal mood and affect. His behavior is normal.         Assessment & Plan:   Physical exam: Screening blood work   ordered Immunizations   Discussed shingrix - others up to date via [facility] Colonoscopy    Never had one -- deferred Eye exams    Up to date  Exercise  Plays tennis Weight  Normal BMI Skin   Sees derm Q 6 months - Dr Jarome Matin Substance abuse    none  See Problem List for Assessment and Plan of chronic medical problems.    FU in one year

## 2019-05-14 NOTE — Patient Instructions (Addendum)
Tests ordered today. Your results will be released to [place] (or called to you) after review.  If any changes need to be made, you will be notified at that same time.  All other Health Maintenance issues reviewed.   All recommended immunizations and age-appropriate screenings are up-to-date or discussed.  No immunization administered today.   An EKG was done today.  Medications reviewed and updated.  Changes include :   none    Please followup in 12 months for a physical, sooner if needed    Health Maintenance, Male Adopting a healthy lifestyle and getting preventive care are important in promoting health and wellness. Ask your health care provider about:  The right schedule for you to have regular tests and exams.  Things you can do on your own to prevent diseases and keep yourself healthy. What should I know about diet, weight, and exercise? Eat a healthy diet   Eat a diet that includes plenty of vegetables, fruits, low-fat dairy products, and lean protein.  Do not eat a lot of foods that are high in solid fats, added sugars, or sodium. Maintain a healthy weight Body mass index (BMI) is a measurement that can be used to identify possible weight problems. It estimates body fat based on height and weight. Your health care provider can help determine your BMI and help you achieve or maintain a healthy weight. Get regular exercise Get regular exercise. This is one of the most important things you can do for your health. Most adults should:  Exercise for at least 150 minutes each week. The exercise should increase your heart rate and make you sweat (moderate-intensity exercise).  Do strengthening exercises at least twice a week. This is in addition to the moderate-intensity exercise.  Spend less time sitting. Even light physical activity can be beneficial. Watch cholesterol and blood lipids Have your blood tested for lipids and cholesterol at 74 years of age, then have this test  every 5 years. You may need to have your cholesterol levels checked more often if:  Your lipid or cholesterol levels are high.  You are older than 74 years of age.  You are at high risk for heart disease. What should I know about cancer screening? Many types of cancers can be detected early and may often be prevented. Depending on your health history and family history, you may need to have cancer screening at various ages. This may include screening for:  Colorectal cancer.  Prostate cancer.  Skin cancer.  Lung cancer. What should I know about heart disease, diabetes, and high blood pressure? Blood pressure and heart disease  High blood pressure causes heart disease and increases the risk of stroke. This is more likely to develop in people who have high blood pressure readings, are of African descent, or are overweight.  Talk with your health care provider about your target blood pressure readings.  Have your blood pressure checked: ? Every 3-5 years if you are 82-73 years of age. ? Every year if you are 30 years old or older.  If you are between the ages of 83 and 23 and are a current or former smoker, ask your health care provider if you should have a one-time screening for abdominal aortic aneurysm (AAA). Diabetes Have regular diabetes screenings. This checks your fasting blood sugar level. Have the screening done:  Once every three years after age 58 if you are at a normal weight and have a low risk for diabetes.  More often and  at a younger age if you are overweight or have a high risk for diabetes. What should I know about preventing infection? Hepatitis B If you have a higher risk for hepatitis B, you should be screened for this virus. Talk with your health care provider to find out if you are at risk for hepatitis B infection. Hepatitis C Blood testing is recommended for:  Everyone born from [birth year] through 1965.  Anyone with known risk factors for hepatitis C.  Sexually transmitted infections (STIs)  You should be screened each year for STIs, including gonorrhea and chlamydia, if: ? You are sexually active and are younger than 74 years of age. ? You are older than 74 years of age and your health care provider tells you that you are at risk for this type of infection. ? Your sexual activity has changed since you were last screened, and you are at increased risk for chlamydia or gonorrhea. Ask your health care provider if you are at risk.  Ask your health care provider about whether you are at high risk for HIV. Your health care provider may recommend a prescription medicine to help prevent HIV infection. If you choose to take medicine to prevent HIV, you should first get tested for HIV. You should then be tested every 3 months for as long as you are taking the medicine. Follow these instructions at home: Lifestyle  Do not use any products that contain nicotine or tobacco, such as cigarettes, e-cigarettes, and chewing tobacco. If you need help quitting, ask your health care provider.  Do not use street drugs.  Do not share needles.  Ask your health care provider for help if you need support or information about quitting drugs. Alcohol use  Do not drink alcohol if your health care provider tells you not to drink.  If you drink alcohol: ? Limit how much you have to 0-2 drinks a day. ? Be aware of how much alcohol is in your drink. In the [place] one drink equals one 12 oz bottle of beer (355 mL), one 5 oz glass of wine (148 mL), or one 1 oz glass of hard liquor (44 mL). General instructions  Schedule regular health, dental, and eye exams.  Stay current with your vaccines.  Tell your health care provider if: ? You often feel depressed. ? You have ever been abused or do not feel safe at home. Summary  Adopting a healthy lifestyle and getting preventive care are important in promoting health and wellness.  Follow your health care provider's  instructions about healthy diet, exercising, and getting tested or screened for diseases.  Follow your health care provider's instructions on monitoring your cholesterol and blood pressure. This information is not intended to replace advice given to you by your health care provider. Make sure you discuss any questions you have with your health care provider. Document Released: 04/13/2008 Document Revised: 10/09/2018 Document Reviewed: 10/09/2018 Elsevier Patient Education  2020 [employer].

## 2019-05-15 ENCOUNTER — Other Ambulatory Visit: Payer: Self-pay

## 2019-05-15 ENCOUNTER — Other Ambulatory Visit: Payer: Self-pay | Admitting: Internal Medicine

## 2019-05-15 ENCOUNTER — Ambulatory Visit (INDEPENDENT_AMBULATORY_CARE_PROVIDER_SITE_OTHER): Payer: Medicare HMO | Admitting: Internal Medicine

## 2019-05-15 ENCOUNTER — Other Ambulatory Visit (INDEPENDENT_AMBULATORY_CARE_PROVIDER_SITE_OTHER): Payer: Medicare HMO

## 2019-05-15 ENCOUNTER — Encounter: Payer: Self-pay | Admitting: Internal Medicine

## 2019-05-15 VITALS — BP 170/86 | HR 57 | Temp 97.8°F | Resp 16 | Ht 71.5 in | Wt 168.0 lb

## 2019-05-15 DIAGNOSIS — R7989 Other specified abnormal findings of blood chemistry: Secondary | ICD-10-CM | POA: Insufficient documentation

## 2019-05-15 DIAGNOSIS — Z0000 Encounter for general adult medical examination without abnormal findings: Secondary | ICD-10-CM

## 2019-05-15 DIAGNOSIS — I10 Essential (primary) hypertension: Secondary | ICD-10-CM | POA: Diagnosis not present

## 2019-05-15 DIAGNOSIS — R252 Cramp and spasm: Secondary | ICD-10-CM

## 2019-05-15 DIAGNOSIS — Z96651 Presence of right artificial knee joint: Secondary | ICD-10-CM | POA: Insufficient documentation

## 2019-05-15 LAB — LIPID PANEL
Cholesterol: 176 mg/dL (ref 0–200)
HDL: 51.2 mg/dL (ref >39.00)
LDL Cholesterol: 108 mg/dL — ABNORMAL HIGH (ref 0–99)
NonHDL: 124.79
Total CHOL/HDL Ratio: 3
Triglycerides: 82 mg/dL (ref 0.0–149.0)
VLDL: 16.4 mg/dL (ref 0.0–40.0)

## 2019-05-15 LAB — CBC WITH DIFFERENTIAL/PLATELET
Basophils Absolute: 0 10*3/uL (ref 0.0–0.1)
Basophils Relative: 0.8 % (ref 0.0–3.0)
Eosinophils Absolute: 0.1 10*3/uL (ref 0.0–0.7)
Eosinophils Relative: 1.6 % (ref 0.0–5.0)
HCT: 43 % (ref 39.0–52.0)
Hemoglobin: 14.5 g/dL (ref 13.0–17.0)
Lymphocytes Relative: 22.3 % (ref 12.0–46.0)
Lymphs Abs: 1.4 10*3/uL (ref 0.7–4.0)
MCHC: 33.8 g/dL (ref 30.0–36.0)
MCV: 92.8 fl (ref 78.0–100.0)
Monocytes Absolute: 0.7 10*3/uL (ref 0.1–1.0)
Monocytes Relative: 11.5 % (ref 3.0–12.0)
Neutro Abs: 3.9 10*3/uL (ref 1.4–7.7)
Neutrophils Relative %: 63.8 % (ref 43.0–77.0)
Platelets: 241 10*3/uL (ref 150.0–400.0)
RBC: 4.63 Mil/uL (ref 4.22–5.81)
RDW: 12.9 % (ref 11.5–15.5)
WBC: 6.1 10*3/uL (ref 4.0–10.5)

## 2019-05-15 LAB — COMPREHENSIVE METABOLIC PANEL
ALT: 12 U/L (ref 0–53)
AST: 17 U/L (ref 0–37)
Albumin: 4.5 g/dL (ref 3.5–5.2)
Alkaline Phosphatase: 90 U/L (ref 39–117)
BUN: 9 mg/dL (ref 6–23)
CO2: 30 mEq/L (ref 19–32)
Calcium: 9.3 mg/dL (ref 8.4–10.5)
Chloride: 105 mEq/L (ref 96–112)
Creatinine, Ser: 0.88 mg/dL (ref 0.40–1.50)
GFR: 84.56 mL/min (ref >60.00)
Glucose, Bld: 94 mg/dL (ref 70–99)
Potassium: 5.5 mEq/L — ABNORMAL HIGH (ref 3.5–5.1)
Sodium: 140 mEq/L (ref 135–145)
Total Bilirubin: 0.8 mg/dL (ref 0.2–1.2)
Total Protein: 7.1 g/dL (ref 6.0–8.3)

## 2019-05-15 LAB — MAGNESIUM: Magnesium: 1.9 mg/dL (ref 1.5–2.5)

## 2019-05-15 LAB — TSH: TSH: 4.96 u[IU]/mL — ABNORMAL HIGH (ref 0.35–4.50)

## 2019-05-15 NOTE — Assessment & Plan Note (Addendum)
Well controlled at home, high here - but states anxiety about coming to doctors Continue bystolic Continue regular exercise Decrease sodium Continue to monitor BP at home Cmp, cbc, tsh EKG today - sinus bradycardia at 54 bpm, low voltage, otherwise normal, bradycardia is new compared to EKG from 2010 otherwise there is no change

## 2019-05-15 NOTE — Assessment & Plan Note (Signed)
At night Taking otc potassium, magnesium w/o relief Drinks tea, soda during day - not much water - may be dehydration Check cmp, mag level

## 2019-05-19 ENCOUNTER — Telehealth: Payer: Self-pay | Admitting: Emergency Medicine

## 2019-05-19 NOTE — Telephone Encounter (Signed)
Spoke with pt and he states that his BP has been normal during the weekend.   Copied from [place] [phone]. Topic: General - Other >> May 15, 2019  2:29 PM Rainey Pines A wrote: Patient would like a callback from Dr. Quay Burow nurse in regards to his appointment today once she is back in office. Patient stated that he wanted to speak about his BP reading 120/80 is better than the high reading during his appointment.

## 2019-06-30 ENCOUNTER — Other Ambulatory Visit: Payer: Self-pay | Admitting: Internal Medicine

## 2019-06-30 DIAGNOSIS — R69 Illness, unspecified: Secondary | ICD-10-CM | POA: Diagnosis not present

## 2019-07-14 ENCOUNTER — Ambulatory Visit: Payer: Self-pay | Admitting: *Deleted

## 2019-07-14 NOTE — Telephone Encounter (Signed)
Patient had questions about when to take his blood pressure medication Bystolic.  He takes Bystolic 5 mg daily around 1pm.  He states he read that taking the medication in the evening may be more effective.  Advised patient that the most important thing is taking the medication around the same time each day.  Advised him if it is more convenient for him, he can switch to taking the medication in the morning or evening.  He states he may try switching to taking his bystolic in the evenings because this would be more convenient for him.

## 2019-07-28 ENCOUNTER — Telehealth: Payer: Self-pay | Admitting: Internal Medicine

## 2019-07-28 NOTE — Telephone Encounter (Signed)
Spoke with Zachary Turner regarding AWV. Patient stated that he will give office a call back when he is ready to schedule. SF

## 2019-07-31 ENCOUNTER — Other Ambulatory Visit (INDEPENDENT_AMBULATORY_CARE_PROVIDER_SITE_OTHER): Payer: Medicare HMO

## 2019-07-31 DIAGNOSIS — R7989 Other specified abnormal findings of blood chemistry: Secondary | ICD-10-CM

## 2019-07-31 LAB — T4, FREE: Free T4: 0.91 ng/dL (ref 0.60–1.60)

## 2019-07-31 LAB — TSH: TSH: 3.72 u[IU]/mL (ref 0.35–4.50)

## 2019-08-01 ENCOUNTER — Encounter: Payer: Self-pay | Admitting: *Deleted

## 2019-08-07 ENCOUNTER — Telehealth: Payer: Self-pay | Admitting: *Deleted

## 2019-08-07 NOTE — Telephone Encounter (Signed)
Copied from [place] [id number]. Topic: General - Inquiry >> Aug 07, 2019  8:17 AM Lennox Solders wrote: Reason for CRM:pt did not received a copy of last  blood work result and would like another call mailed to home address

## 2019-08-07 NOTE — Telephone Encounter (Signed)
Copy of 07/31/19 labs mailed to pt.

## 2019-09-09 DIAGNOSIS — L820 Inflamed seborrheic keratosis: Secondary | ICD-10-CM | POA: Diagnosis not present

## 2019-09-09 DIAGNOSIS — L438 Other lichen planus: Secondary | ICD-10-CM | POA: Diagnosis not present

## 2019-09-09 DIAGNOSIS — L812 Freckles: Secondary | ICD-10-CM | POA: Diagnosis not present

## 2019-09-09 DIAGNOSIS — D485 Neoplasm of uncertain behavior of skin: Secondary | ICD-10-CM | POA: Diagnosis not present

## 2019-09-09 DIAGNOSIS — L821 Other seborrheic keratosis: Secondary | ICD-10-CM | POA: Diagnosis not present

## 2019-09-09 DIAGNOSIS — Z85828 Personal history of other malignant neoplasm of skin: Secondary | ICD-10-CM | POA: Diagnosis not present

## 2019-09-09 DIAGNOSIS — D1801 Hemangioma of skin and subcutaneous tissue: Secondary | ICD-10-CM | POA: Diagnosis not present

## 2019-12-05 ENCOUNTER — Ambulatory Visit: Payer: Medicare HMO | Attending: Internal Medicine

## 2019-12-05 DIAGNOSIS — Z23 Encounter for immunization: Secondary | ICD-10-CM

## 2019-12-05 NOTE — Progress Notes (Signed)
   Covid-19 Vaccination Clinic  Name:  Zachary Turner    MRN: [id number] DOB: 07-11-1946  12/05/2019  [name] was observed post Covid-19 immunization for 15 minutes without incidence. He was provided with Vaccine Information Sheet and instruction to access the V-Safe system.   [name] was instructed to call 911 with any severe reactions post vaccine: Marland Kitchen Difficulty breathing  . Swelling of your face and throat  . A fast heartbeat  . A bad rash all over your body  . Dizziness and weakness    Immunizations Administered    Name Date Dose VIS Date Route   Pfizer COVID-19 Vaccine 12/05/2019  1:05 PM 0.3 mL 10/10/2019 Intramuscular   Manufacturer: [place]   Lot: [id number]   [place]: [id number]

## 2020-01-28 ENCOUNTER — Other Ambulatory Visit: Payer: Self-pay | Admitting: Internal Medicine

## 2020-03-09 DIAGNOSIS — D0462 Carcinoma in situ of skin of left upper limb, including shoulder: Secondary | ICD-10-CM | POA: Diagnosis not present

## 2020-03-09 DIAGNOSIS — L738 Other specified follicular disorders: Secondary | ICD-10-CM | POA: Diagnosis not present

## 2020-03-09 DIAGNOSIS — L84 Corns and callosities: Secondary | ICD-10-CM | POA: Diagnosis not present

## 2020-03-09 DIAGNOSIS — L821 Other seborrheic keratosis: Secondary | ICD-10-CM | POA: Diagnosis not present

## 2020-03-09 DIAGNOSIS — Z85828 Personal history of other malignant neoplasm of skin: Secondary | ICD-10-CM | POA: Diagnosis not present

## 2020-03-09 DIAGNOSIS — L280 Lichen simplex chronicus: Secondary | ICD-10-CM | POA: Diagnosis not present

## 2020-03-09 DIAGNOSIS — D485 Neoplasm of uncertain behavior of skin: Secondary | ICD-10-CM | POA: Diagnosis not present

## 2020-04-12 DIAGNOSIS — L820 Inflamed seborrheic keratosis: Secondary | ICD-10-CM | POA: Diagnosis not present

## 2020-04-12 DIAGNOSIS — Z85828 Personal history of other malignant neoplasm of skin: Secondary | ICD-10-CM | POA: Diagnosis not present

## 2020-04-21 ENCOUNTER — Other Ambulatory Visit: Payer: Self-pay | Admitting: Internal Medicine

## 2020-07-13 ENCOUNTER — Other Ambulatory Visit: Payer: Self-pay | Admitting: Internal Medicine

## 2020-07-20 DIAGNOSIS — Z01818 Encounter for other preprocedural examination: Secondary | ICD-10-CM | POA: Diagnosis not present

## 2020-07-20 DIAGNOSIS — H4322 Crystalline deposits in vitreous body, left eye: Secondary | ICD-10-CM | POA: Diagnosis not present

## 2020-07-20 DIAGNOSIS — H18453 Nodular corneal degeneration, bilateral: Secondary | ICD-10-CM | POA: Diagnosis not present

## 2020-07-20 DIAGNOSIS — H25812 Combined forms of age-related cataract, left eye: Secondary | ICD-10-CM | POA: Diagnosis not present

## 2020-07-20 DIAGNOSIS — H25811 Combined forms of age-related cataract, right eye: Secondary | ICD-10-CM | POA: Diagnosis not present

## 2020-07-20 NOTE — Patient Instructions (Addendum)
Blood work was ordered.    All other Health Maintenance issues reviewed.   All recommended immunizations and age-appropriate screenings are up-to-date or discussed.  Flu immunization administered today.    Medications reviewed and updated.  Changes include :  none   Your prescription(s) have been submitted to your pharmacy. Please take as directed and contact our office if you believe you are having problem(s) with the medication(s).   Please followup in 1 year    Health Maintenance, Male Adopting a healthy lifestyle and getting preventive care are important in promoting health and wellness. Ask your health care provider about:  The right schedule for you to have regular tests and exams.  Things you can do on your own to prevent diseases and keep yourself healthy. What should I know about diet, weight, and exercise? Eat a healthy diet   Eat a diet that includes plenty of vegetables, fruits, low-fat dairy products, and lean protein.  Do not eat a lot of foods that are high in solid fats, added sugars, or sodium. Maintain a healthy weight Body mass index (BMI) is a measurement that can be used to identify possible weight problems. It estimates body fat based on height and weight. Your health care provider can help determine your BMI and help you achieve or maintain a healthy weight. Get regular exercise Get regular exercise. This is one of the most important things you can do for your health. Most adults should:  Exercise for at least 150 minutes each week. The exercise should increase your heart rate and make you sweat (moderate-intensity exercise).  Do strengthening exercises at least twice a week. This is in addition to the moderate-intensity exercise.  Spend less time sitting. Even light physical activity can be beneficial. Watch cholesterol and blood lipids Have your blood tested for lipids and cholesterol at 75 years of age, then have this test every 5 years. You may  need to have your cholesterol levels checked more often if:  Your lipid or cholesterol levels are high.  You are older than 75 years of age.  You are at high risk for heart disease. What should I know about cancer screening? Many types of cancers can be detected early and may often be prevented. Depending on your health history and family history, you may need to have cancer screening at various ages. This may include screening for:  Colorectal cancer.  Prostate cancer.  Skin cancer.  Lung cancer. What should I know about heart disease, diabetes, and high blood pressure? Blood pressure and heart disease  High blood pressure causes heart disease and increases the risk of stroke. This is more likely to develop in people who have high blood pressure readings, are of African descent, or are overweight.  Talk with your health care provider about your target blood pressure readings.  Have your blood pressure checked: ? Every 3-5 years if you are 39-40 years of age. ? Every year if you are 57 years old or older.  If you are between the ages of 63 and 73 and are a current or former smoker, ask your health care provider if you should have a one-time screening for abdominal aortic aneurysm (AAA). Diabetes Have regular diabetes screenings. This checks your fasting blood sugar level. Have the screening done:  Once every three years after age 54 if you are at a normal weight and have a low risk for diabetes.  More often and at a younger age if you are overweight or have a  a high risk for diabetes. What should I know about preventing infection? Hepatitis B If you have a higher risk for hepatitis B, you should be screened for this virus. Talk with your health care provider to find out if you are at risk for hepatitis B infection. Hepatitis C Blood testing is recommended for:  Everyone born from 1945 through 1965.  Anyone with known risk factors for hepatitis C. Sexually transmitted  infections (STIs)  You should be screened each year for STIs, including gonorrhea and chlamydia, if: ? You are sexually active and are younger than 75 years of age. ? You are older than 75 years of age and your health care provider tells you that you are at risk for this type of infection. ? Your sexual activity has changed since you were last screened, and you are at increased risk for chlamydia or gonorrhea. Ask your health care provider if you are at risk.  Ask your health care provider about whether you are at high risk for HIV. Your health care provider may recommend a prescription medicine to help prevent HIV infection. If you choose to take medicine to prevent HIV, you should first get tested for HIV. You should then be tested every 3 months for as long as you are taking the medicine. Follow these instructions at home: Lifestyle  Do not use any products that contain nicotine or tobacco, such as cigarettes, e-cigarettes, and chewing tobacco. If you need help quitting, ask your health care provider.  Do not use street drugs.  Do not share needles.  Ask your health care provider for help if you need support or information about quitting drugs. Alcohol use  Do not drink alcohol if your health care provider tells you not to drink.  If you drink alcohol: ? Limit how much you have to 0-2 drinks a day. ? Be aware of how much alcohol is in your drink. In the [place] one drink equals one 12 oz bottle of beer (355 mL), one 5 oz glass of wine (148 mL), or one 1 oz glass of hard liquor (44 mL). General instructions  Schedule regular health, dental, and eye exams.  Stay current with your vaccines.  Tell your health care provider if: ? You often feel depressed. ? You have ever been abused or do not feel safe at home. Summary  Adopting a healthy lifestyle and getting preventive care are important in promoting health and wellness.  Follow your health care provider's instructions about  healthy diet, exercising, and getting tested or screened for diseases.  Follow your health care provider's instructions on monitoring your cholesterol and blood pressure. This information is not intended to replace advice given to you by your health care provider. Make sure you discuss any questions you have with your health care provider. Document Revised: 10/09/2018 Document Reviewed: 10/09/2018 Elsevier Patient Education  2020 Elsevier Inc.  

## 2020-07-20 NOTE — Progress Notes (Signed)
Subjective:    Patient ID: Zachary Turner, male    DOB: [birth year], 74 y.o.   MRN: [id number]  HPI He is here for a physical exam.   He has not been sick since he was here last.   His BP is well controlled.  The bystolic works well.  He denies side effects.   He will be having cataract surgery soon.    He has no concerns.   Medications and allergies reviewed with patient and updated if appropriate.  Patient Active Problem List   Diagnosis Date Noted  . S/P total knee arthroplasty, right 05/15/2019  . Leg cramping 05/15/2019  . Elevated TSH 05/15/2019  . Essential hypertension 02/26/2019  . Bicipital tendonitis of right shoulder 12/26/2017  . Incomplete tear of left rotator cuff 11/15/2017  . Arthritis of both glenohumeral joints 11/15/2017  . HYPERPLASIA PROSTATE UNS W/O UR OBST & OTH LUTS 10/13/2009    Current Outpatient Medications on File Prior to Visit  Medication Sig Dispense Refill  . BYSTOLIC 5 MG tablet TAKE 1 TABLET BY MOUTH EVERY DAY 30 tablet 0  . ibuprofen (ADVIL,MOTRIN) 200 MG tablet Take 400 mg by mouth 2 (two) times daily as needed for pain.    Marland Kitchen MAGNESIUM CITRATE PO Take 1 tablet by mouth daily.    . Multiple Vitamin (MULTIVITAMIN WITH MINERALS) TABS Take 1 tablet by mouth daily. Spectracite    . POTASSIUM GLUCONATE PO Take 1 tablet by mouth 2 (two) times daily.    Marland Kitchen tretinoin (RETIN-A) 0.05 % cream APPLY TO FACE EVERY NIGHT -- PER MD NOT COVD     No current facility-administered medications on file prior to visit.    Past Medical History:  Diagnosis Date  . Hypertension     Past Surgical History:  Procedure Laterality Date  . HERNIA REPAIR    . REPLACEMENT TOTAL KNEE    . TONSILLECTOMY      Social History   Socioeconomic History  . Marital status: Married    Spouse name: Not on file  . Number of children: Not on file  . Years of education: Not on file  . Highest education level: Not on file  Occupational History  . Not on file    Tobacco Use  . Smoking status: Never Smoker  . Smokeless tobacco: Never Used  Substance and Sexual Activity  . Alcohol use: Yes    Comment: Socially  . Drug use: No  . Sexual activity: Not on file  Other Topics Concern  . Not on file  Social History Narrative  . Not on file   Social Determinants of Health   Financial Resource Strain:   . Difficulty of Paying Living Expenses: Not on file  Food Insecurity:   . Worried About Charity fundraiser in the Last Year: Not on file  . Ran Out of Food in the Last Year: Not on file  Transportation Needs:   . Lack of Transportation (Medical): Not on file  . Lack of Transportation (Non-Medical): Not on file  Physical Activity:   . Days of Exercise per Week: Not on file  . Minutes of Exercise per Session: Not on file  Stress:   . Feeling of Stress : Not on file  Social Connections:   . Frequency of Communication with Friends and Family: Not on file  . Frequency of Social Gatherings with Friends and Family: Not on file  . Attends Religious Services: Not on file  . Active Member of  Clubs or Organizations: Not on file  . Attends Archivist Meetings: Not on file  . Marital Status: Not on file    Family History  Problem Relation Age of Onset  . Hypertension Mother   . Hypertension Father   . Obesity Sister   . Atrial fibrillation Sister   . Heart disease Brother   . Obesity Sister     Review of Systems  Constitutional: Negative for chills, fatigue and fever.  Eyes: Negative for visual disturbance.  Respiratory: Negative for cough, shortness of breath and wheezing.   Cardiovascular: Negative for chest pain, palpitations and leg swelling.  Gastrointestinal: Negative for abdominal pain, blood in stool, constipation, diarrhea and nausea.       No gerd  Genitourinary: Negative for difficulty urinating and dysuria.  Musculoskeletal: Positive for back pain (stiffness). Negative for arthralgias.  Skin: Negative for color change  and rash.  Neurological: Negative for light-headedness and headaches.  Psychiatric/Behavioral: Negative for dysphoric mood. The patient is not nervous/anxious.        Objective:   Vitals:   07/21/20 1524  BP: (!) 142/78  Pulse: 62  Temp: 97.6 F (36.4 C)  SpO2: 96%   Filed Weights   07/21/20 1524  Weight: 170 lb 6.4 oz (77.3 kg)   Body mass index is 23.43 kg/m.  BP Readings from Last 3 Encounters:  07/21/20 (!) 142/78  05/15/19 (!) 170/86  03/26/18 (!) 148/88    Wt Readings from Last 3 Encounters:  07/21/20 170 lb 6.4 oz (77.3 kg)  05/15/19 168 lb (76.2 kg)  03/26/18 168 lb (76.2 kg)     Physical Exam Constitutional: He appears well-developed and well-nourished. No distress.  HENT:  Head: Normocephalic and atraumatic.  Right Ear: External ear normal.  Left Ear: External ear normal.  Mouth/Throat: Oropharynx is clear and moist.  Normal ear canals and TM b/l  Eyes: Conjunctivae and EOM are normal.  Neck: Neck supple. No tracheal deviation present. No thyromegaly present.  No carotid bruit  Cardiovascular: Normal rate, regular rhythm, normal heart sounds and intact distal pulses.   No murmur heard. Pulmonary/Chest: Effort normal and breath sounds normal. No respiratory distress. He has no wheezes. He has no rales.  Abdominal: Soft. He exhibits no distension. There is no tenderness.  Genitourinary: deferred  Musculoskeletal: He exhibits no edema.  Lymphadenopathy:   He has no cervical adenopathy.  Skin: Skin is warm and dry. He is not diaphoretic.  Psychiatric: He has a normal mood and affect. His behavior is normal.         Assessment & Plan:   Physical exam: Screening blood work  ordered Immunizations  Had covid,  Had flu today.  Discussed shingrix.  ? Had pneumovax  - will ck with pharmacy Colonoscopy   Never had one - deferred it Eye exams   Up to date  Exercise   regular Weight  Good Substance abuse   None Derm - 2/year - dr drew Ronnald Ramp  See  Problem List for Assessment and Plan of chronic medical problems.   This visit occurred during the SARS-CoV-2 public health emergency.  Safety protocols were in place, including screening questions prior to the visit, additional usage of staff PPE, and extensive cleaning of exam room while observing appropriate contact time as indicated for disinfecting solutions.

## 2020-07-21 ENCOUNTER — Other Ambulatory Visit: Payer: Self-pay

## 2020-07-21 ENCOUNTER — Ambulatory Visit (INDEPENDENT_AMBULATORY_CARE_PROVIDER_SITE_OTHER): Payer: Medicare HMO | Admitting: Internal Medicine

## 2020-07-21 ENCOUNTER — Encounter: Payer: Self-pay | Admitting: Internal Medicine

## 2020-07-21 VITALS — BP 142/78 | HR 62 | Temp 97.6°F | Wt 170.4 lb

## 2020-07-21 DIAGNOSIS — Z23 Encounter for immunization: Secondary | ICD-10-CM | POA: Diagnosis not present

## 2020-07-21 DIAGNOSIS — I10 Essential (primary) hypertension: Secondary | ICD-10-CM

## 2020-07-21 DIAGNOSIS — Z0000 Encounter for general adult medical examination without abnormal findings: Secondary | ICD-10-CM | POA: Diagnosis not present

## 2020-07-21 DIAGNOSIS — E875 Hyperkalemia: Secondary | ICD-10-CM

## 2020-07-21 MED ORDER — NEBIVOLOL HCL 5 MG PO TABS: 5.0000 mg | ORAL_TABLET | Freq: Every day | ORAL | 3 refills | Status: DC

## 2020-07-21 NOTE — Assessment & Plan Note (Signed)
Chronic BP well controlled at home Current regimen effective and well tolerated Continue current medications at current doses cmp

## 2020-07-22 ENCOUNTER — Other Ambulatory Visit: Payer: Self-pay | Admitting: Internal Medicine

## 2020-07-22 LAB — COMPREHENSIVE METABOLIC PANEL
AG Ratio: 1.7 (calc) (ref 1.0–2.5)
ALT: 8 U/L — ABNORMAL LOW (ref 9–46)
AST: 15 U/L (ref 10–35)
Albumin: 4.2 g/dL (ref 3.6–5.1)
Alkaline phosphatase (APISO): 78 U/L (ref 35–144)
BUN: 10 mg/dL (ref 7–25)
CO2: 31 mmol/L (ref 20–32)
Calcium: 9.4 mg/dL (ref 8.6–10.3)
Chloride: 105 mmol/L (ref 98–110)
Creat: 0.93 mg/dL (ref 0.70–1.18)
Globulin: 2.5 g/dL (calc) (ref 1.9–3.7)
Glucose, Bld: 96 mg/dL (ref 65–99)
Potassium: 5.9 mmol/L — ABNORMAL HIGH (ref 3.5–5.3)
Sodium: 142 mmol/L (ref 135–146)
Total Bilirubin: 0.9 mg/dL (ref 0.2–1.2)
Total Protein: 6.7 g/dL (ref 6.1–8.1)

## 2020-07-22 LAB — LIPID PANEL
Cholesterol: 168 mg/dL (ref <200)
HDL: 49 mg/dL (ref >=40)
LDL Cholesterol (Calc): 100 mg/dL (calc) — ABNORMAL HIGH
Non-HDL Cholesterol (Calc): 119 mg/dL (calc) (ref <130)
Total CHOL/HDL Ratio: 3.4 (calc) (ref <5.0)
Triglycerides: 92 mg/dL (ref <150)

## 2020-07-22 LAB — CBC WITH DIFFERENTIAL/PLATELET
Absolute Monocytes: 621 cells/uL (ref 200–950)
Basophils Absolute: 40 cells/uL (ref 0–200)
Basophils Relative: 0.7 %
Eosinophils Absolute: 143 cells/uL (ref 15–500)
Eosinophils Relative: 2.5 %
HCT: 40.6 % (ref 38.5–50.0)
Hemoglobin: 13.7 g/dL (ref 13.2–17.1)
Lymphs Abs: 1476 cells/uL (ref 850–3900)
MCH: 31.4 pg (ref 27.0–33.0)
MCHC: 33.7 g/dL (ref 32.0–36.0)
MCV: 92.9 fL (ref 80.0–100.0)
MPV: 10.2 fL (ref 7.5–12.5)
Monocytes Relative: 10.9 %
Neutro Abs: 3420 cells/uL (ref 1500–7800)
Neutrophils Relative %: 60 %
Platelets: 233 10*3/uL (ref 140–400)
RBC: 4.37 10*6/uL (ref 4.20–5.80)
RDW: 12 % (ref 11.0–15.0)
Total Lymphocyte: 25.9 %
WBC: 5.7 10*3/uL (ref 3.8–10.8)

## 2020-07-22 LAB — TSH: TSH: 4.5 mIU/L (ref 0.40–4.50)

## 2020-07-22 NOTE — Addendum Note (Signed)
Addended by: Binnie Rail on: 07/22/2020 03:48 PM   Modules accepted: Orders

## 2020-07-29 DIAGNOSIS — H25812 Combined forms of age-related cataract, left eye: Secondary | ICD-10-CM | POA: Diagnosis not present

## 2020-07-29 DIAGNOSIS — H2512 Age-related nuclear cataract, left eye: Secondary | ICD-10-CM | POA: Diagnosis not present

## 2020-08-05 DIAGNOSIS — R69 Illness, unspecified: Secondary | ICD-10-CM | POA: Diagnosis not present

## 2020-08-12 DIAGNOSIS — H2511 Age-related nuclear cataract, right eye: Secondary | ICD-10-CM | POA: Diagnosis not present

## 2020-08-12 DIAGNOSIS — H25811 Combined forms of age-related cataract, right eye: Secondary | ICD-10-CM | POA: Diagnosis not present

## 2020-09-06 ENCOUNTER — Telehealth: Payer: Self-pay | Admitting: Internal Medicine

## 2020-09-07 MED ORDER — BYSTOLIC 5 MG PO TABS
5.0000 mg | ORAL_TABLET | Freq: Every day | ORAL | 1 refills | Status: DC
Start: 2020-09-07 — End: 2021-05-18

## 2020-09-07 NOTE — Telephone Encounter (Signed)
   Patient calling to report the BYSTOLIC 5 MG tablet is covered at a better cost if prescribed as brand name only. The generic is $483 The brand name is $300

## 2020-09-07 NOTE — Telephone Encounter (Signed)
sent 

## 2020-09-07 NOTE — Addendum Note (Signed)
Addended by: Binnie Rail on: 09/07/2020 11:57 AM   Modules accepted: Orders

## 2020-09-13 DIAGNOSIS — Z85828 Personal history of other malignant neoplasm of skin: Secondary | ICD-10-CM | POA: Diagnosis not present

## 2020-09-13 DIAGNOSIS — L821 Other seborrheic keratosis: Secondary | ICD-10-CM | POA: Diagnosis not present

## 2020-09-13 DIAGNOSIS — L812 Freckles: Secondary | ICD-10-CM | POA: Diagnosis not present

## 2020-11-07 ENCOUNTER — Other Ambulatory Visit: Payer: Medicare HMO

## 2020-11-23 DIAGNOSIS — S82025A Nondisplaced longitudinal fracture of left patella, initial encounter for closed fracture: Secondary | ICD-10-CM | POA: Diagnosis not present

## 2020-12-21 DIAGNOSIS — S82025D Nondisplaced longitudinal fracture of left patella, subsequent encounter for closed fracture with routine healing: Secondary | ICD-10-CM | POA: Diagnosis not present

## 2021-03-29 DIAGNOSIS — D485 Neoplasm of uncertain behavior of skin: Secondary | ICD-10-CM | POA: Diagnosis not present

## 2021-03-29 DIAGNOSIS — L820 Inflamed seborrheic keratosis: Secondary | ICD-10-CM | POA: Diagnosis not present

## 2021-03-29 DIAGNOSIS — L570 Actinic keratosis: Secondary | ICD-10-CM | POA: Diagnosis not present

## 2021-03-29 DIAGNOSIS — L821 Other seborrheic keratosis: Secondary | ICD-10-CM | POA: Diagnosis not present

## 2021-03-29 DIAGNOSIS — D1801 Hemangioma of skin and subcutaneous tissue: Secondary | ICD-10-CM | POA: Diagnosis not present

## 2021-03-29 DIAGNOSIS — L812 Freckles: Secondary | ICD-10-CM | POA: Diagnosis not present

## 2021-03-29 DIAGNOSIS — Z85828 Personal history of other malignant neoplasm of skin: Secondary | ICD-10-CM | POA: Diagnosis not present

## 2021-05-09 ENCOUNTER — Telehealth: Payer: Self-pay | Admitting: Internal Medicine

## 2021-05-09 MED ORDER — LORAZEPAM 0.5 MG PO TABS: 0.2500 mg | ORAL_TABLET | Freq: Every evening | ORAL | 0 refills | Status: DC | PRN

## 2021-05-09 NOTE — Telephone Encounter (Signed)
Spoke with patient today. 

## 2021-05-09 NOTE — Telephone Encounter (Signed)
Please express my deepest condolences.  Please ask what happened this is something very unexpected.  I sent a prescription for [facility]epam to [facility].  He can take half a pill or 1 pill at night as needed.  If he is too groggy in the morning he should call and we can adjust the medication.

## 2021-05-09 NOTE — Telephone Encounter (Signed)
   Patient called and said that his wife recently passed away and he said that he is not doing well. He was wondering if something could be called in to help him sleep at night. He said that [facility] can be sent to [id number] - [place], [place] - [address]. Please advise

## 2021-05-18 ENCOUNTER — Other Ambulatory Visit: Payer: Self-pay | Admitting: Internal Medicine

## 2021-05-26 ENCOUNTER — Encounter: Payer: Self-pay | Admitting: Internal Medicine

## 2021-05-26 DIAGNOSIS — Z833 Family history of diabetes mellitus: Secondary | ICD-10-CM | POA: Insufficient documentation

## 2021-06-06 DIAGNOSIS — H26493 Other secondary cataract, bilateral: Secondary | ICD-10-CM | POA: Diagnosis not present

## 2021-08-22 ENCOUNTER — Encounter: Payer: Self-pay | Admitting: Internal Medicine

## 2021-08-22 ENCOUNTER — Telehealth: Payer: Self-pay | Admitting: Internal Medicine

## 2021-08-22 NOTE — Telephone Encounter (Signed)
Patient states he has difficulty sleeping due to wife's death  Patient states daughter gave him xanax and the xanax has helped tremendously with sleeping  Patient is requesting a rx for xanax.  Advised patient to make an appt, patients' next appt is 08-22-2021  Patient is requesting a call back before appt on 08-22-2021

## 2021-08-22 NOTE — Telephone Encounter (Signed)
Spoke with patient today. 

## 2021-08-22 NOTE — Telephone Encounter (Signed)
I would like to see him

## 2021-08-22 NOTE — Progress Notes (Signed)
Subjective:    Patient ID: Zachary Turner, male    DOB: 1946-07-07, 75 y.o.   MRN: [id number]  This visit occurred during the SARS-CoV-2 public health emergency.  Safety protocols were in place, including screening questions prior to the visit, additional usage of staff PPE, and extensive cleaning of exam room while observing appropriate contact time as indicated for disinfecting solutions.    HPI The patient is here for an acute visit.  Difficulty sleeping - his wife died in an accident a few months ago and has had difficulty sleeping since then.  I originally prescribed ativan which she did not take many because it did cause too much drowsiness in the morning.  And his daughter gave him some xanax more recently and that did help -- he wanted to get a prescription for that.     Eating well.  He is still working.  He has started talking to a therapist and that has helped some.  He has good support from family and friends  Medications and allergies reviewed with patient and updated if appropriate.  Patient Active Problem List   Diagnosis Date Noted   S/P total knee arthroplasty, right 05/15/2019   Leg cramping 05/15/2019   Elevated TSH 05/15/2019   Essential hypertension 02/26/2019   Bicipital tendonitis of right shoulder 12/26/2017   Incomplete tear of left rotator cuff 11/15/2017   Arthritis of both glenohumeral joints 11/15/2017   HYPERPLASIA PROSTATE UNS W/O UR OBST & OTH LUTS 10/13/2009    Current Outpatient Medications on File Prior to Visit  Medication Sig Dispense Refill   BYSTOLIC 5 MG tablet TAKE 1 TABLET BY MOUTH EVERY DAY 90 tablet 1   fluocinonide cream (LIDEX) 4.09 % Apply 1 application topically 2 (two) times daily.     ibuprofen (ADVIL,MOTRIN) 200 MG tablet Take 400 mg by mouth 2 (two) times daily as needed for pain.     MAGNESIUM CITRATE PO Take 1 tablet by mouth daily.     Multiple Vitamin (MULTIVITAMIN WITH MINERALS) TABS Take 1 tablet by mouth daily.  Spectracite     No current facility-administered medications on file prior to visit.    Past Medical History:  Diagnosis Date   Hypertension     Past Surgical History:  Procedure Laterality Date   HERNIA REPAIR     REPLACEMENT TOTAL KNEE     TONSILLECTOMY      Social History   Socioeconomic History   Marital status: Married    Spouse name: Not on file   Number of children: Not on file   Years of education: Not on file   Highest education level: Not on file  Occupational History   Not on file  Tobacco Use   Smoking status: Never   Smokeless tobacco: Never  Substance and Sexual Activity   Alcohol use: Yes    Comment: Socially   Drug use: No   Sexual activity: Not on file  Other Topics Concern   Not on file  Social History Narrative   Not on file   Social Determinants of Health   Financial Resource Strain: Not on file  Food Insecurity: Not on file  Transportation Needs: Not on file  Physical Activity: Not on file  Stress: Not on file  Social Connections: Not on file    Family History  Problem Relation Age of Onset   Hypertension Mother    Hypertension Father    Obesity Sister    Atrial fibrillation Sister  Heart disease Brother    Obesity Sister     Review of Systems     Objective:   Vitals:   08/23/21 0935  BP: (!) 142/80  Pulse: 71  Temp: 97.9 F (36.6 C)  SpO2: 97%   BP Readings from Last 3 Encounters:  08/23/21 (!) 142/80  07/21/20 (!) 142/78  05/15/19 (!) 170/86   Wt Readings from Last 3 Encounters:  08/23/21 159 lb (72.1 kg)  07/21/20 170 lb 6.4 oz (77.3 kg)  05/15/19 168 lb (76.2 kg)   Body mass index is 21.87 kg/m.   Physical Exam Constitutional:      General: He is not in acute distress.    Appearance: Normal appearance. He is not ill-appearing.  HENT:     Head: Normocephalic and atraumatic.  Skin:    General: Skin is warm and dry.  Neurological:     Mental Status: He is alert.  Psychiatric:        Mood and  Affect: Mood normal.        Behavior: Behavior normal.        Thought Content: Thought content normal.        Judgment: Judgment normal.           Assessment & Plan:    See Problem List for Assessment and Plan of chronic medical problems.

## 2021-08-23 ENCOUNTER — Ambulatory Visit (INDEPENDENT_AMBULATORY_CARE_PROVIDER_SITE_OTHER): Payer: Medicare HMO | Admitting: Internal Medicine

## 2021-08-23 ENCOUNTER — Other Ambulatory Visit: Payer: Self-pay

## 2021-08-23 DIAGNOSIS — F5104 Psychophysiologic insomnia: Secondary | ICD-10-CM | POA: Diagnosis not present

## 2021-08-23 DIAGNOSIS — G4700 Insomnia, unspecified: Secondary | ICD-10-CM | POA: Insufficient documentation

## 2021-08-23 DIAGNOSIS — I10 Essential (primary) hypertension: Secondary | ICD-10-CM | POA: Diagnosis not present

## 2021-08-23 DIAGNOSIS — G479 Sleep disorder, unspecified: Secondary | ICD-10-CM | POA: Insufficient documentation

## 2021-08-23 DIAGNOSIS — R69 Illness, unspecified: Secondary | ICD-10-CM | POA: Diagnosis not present

## 2021-08-23 MED ORDER — BYSTOLIC 5 MG PO TABS: 5.0000 mg | ORAL_TABLET | Freq: Every day | ORAL | 1 refills | Status: DC

## 2021-08-23 MED ORDER — ALPRAZOLAM 0.5 MG PO TABS: 0.2500 mg | ORAL_TABLET | Freq: Every evening | ORAL | 5 refills | Status: DC | PRN

## 2021-08-23 NOTE — Assessment & Plan Note (Signed)
Subacute-new Related to an expected death of his wife due to an accident He is often able to get to sleep, but will wake up in the middle of the night and not able to get back to sleep Ativan made him too groggy in the morning He has tried his daughter Xanax and that worked well Discussed the addictive nature and possible dependence that could develop-he is familiar with the medication and did read about it and feels that it has significantly improved the quality of his life because he is able to get more sleep now Start Xanax 0.25 mg - 0.5 mg at at bedtime for the night as needed-he will most likely stick to the 0.25 mg dose which has been working well for him

## 2021-08-23 NOTE — Patient Instructions (Addendum)
      Medications changes include :   xanax 0.25-0.5 mg nightly as needed    Your prescription(s) have been submitted to your pharmacy. Please take as directed and contact our office if you believe you are having problem(s) with the medication(s).     Please followup in 6 months

## 2021-08-23 NOTE — Assessment & Plan Note (Signed)
Chronic BP well controlled at home - [phone] Continue bystolic 5 mg daily cmp

## 2021-09-06 ENCOUNTER — Telehealth: Payer: Self-pay | Admitting: Internal Medicine

## 2021-09-06 NOTE — Telephone Encounter (Signed)
Patient says he received letter from [facility] stating that BYSTOLIC 5 MG tablet would not longer be covered in 2023  Patient wants to know if there can be an override to this or if he needs to be looking into alternatives  Please call [phone]

## 2021-09-08 MED ORDER — ATENOLOL-CHLORTHALIDONE 50-25 MG PO TABS: 1.0000 | ORAL_TABLET | Freq: Every day | ORAL | 5 refills | Status: DC

## 2021-09-08 NOTE — Telephone Encounter (Signed)
Pt. Returned call to [place]. States he has a question about BP medication.     Callback #- [phone]

## 2021-09-08 NOTE — Telephone Encounter (Signed)
Lets try an alternative.   We will see it is works well - we may need to adjust the dose.  I do want him to monitor his BP at home.  I will send a 30 day to [facility] - he can start it now or once his bystolic runs out.

## 2021-09-08 NOTE — Telephone Encounter (Signed)
Message left for patient today with alternative and info.

## 2021-09-09 NOTE — Telephone Encounter (Signed)
Spoke with patient today and he will use Bystolic then switch to new med.  If new med does not work he will go back and pay out of pocked for Bystolic.

## 2021-10-03 DIAGNOSIS — L738 Other specified follicular disorders: Secondary | ICD-10-CM | POA: Diagnosis not present

## 2021-10-03 DIAGNOSIS — D485 Neoplasm of uncertain behavior of skin: Secondary | ICD-10-CM | POA: Diagnosis not present

## 2021-10-03 DIAGNOSIS — Z85828 Personal history of other malignant neoplasm of skin: Secondary | ICD-10-CM | POA: Diagnosis not present

## 2021-10-03 DIAGNOSIS — L905 Scar conditions and fibrosis of skin: Secondary | ICD-10-CM | POA: Diagnosis not present

## 2021-10-03 DIAGNOSIS — L989 Disorder of the skin and subcutaneous tissue, unspecified: Secondary | ICD-10-CM | POA: Diagnosis not present

## 2021-10-03 DIAGNOSIS — L821 Other seborrheic keratosis: Secondary | ICD-10-CM | POA: Diagnosis not present

## 2021-10-03 DIAGNOSIS — L812 Freckles: Secondary | ICD-10-CM | POA: Diagnosis not present

## 2021-10-17 ENCOUNTER — Telehealth: Payer: Self-pay

## 2021-10-17 MED ORDER — NEBIVOLOL HCL 5 MG PO TABS: 5.0000 mg | ORAL_TABLET | Freq: Every day | ORAL | 1 refills | Status: DC

## 2021-10-17 NOTE — Telephone Encounter (Signed)
Patient would rather began taking the other version of bystolic so that insurance can cover is. Nebivolol  He wants this in place of the Atenol.

## 2021-10-17 NOTE — Telephone Encounter (Signed)
Prescription sent to pharmacy-can stop atenolol-chlorthalidone

## 2021-10-18 NOTE — Telephone Encounter (Signed)
Spoke with patient today. 

## 2021-11-30 DIAGNOSIS — K045 Chronic apical periodontitis: Secondary | ICD-10-CM | POA: Diagnosis not present

## 2021-12-07 ENCOUNTER — Telehealth: Payer: Medicare HMO | Admitting: Physician Assistant

## 2021-12-07 ENCOUNTER — Telehealth: Payer: Self-pay | Admitting: Internal Medicine

## 2021-12-07 DIAGNOSIS — U071 COVID-19: Secondary | ICD-10-CM

## 2021-12-07 MED ORDER — MOLNUPIRAVIR EUA 200MG CAPSULE: 4.0000 | ORAL_CAPSULE | Freq: Two times a day (BID) | ORAL | 0 refills | Status: AC

## 2021-12-07 NOTE — Telephone Encounter (Signed)
Patient has virtual visit scheduled with Dr. Maudie Mercury tomorrow.

## 2021-12-07 NOTE — Telephone Encounter (Signed)
Connected to [facility] 2.8.2023.  Caller states her father is Covid positive. He is having cough, sneezing, congested, sore throat. Started three days ago. Afebrile.  Advised to call PCP.

## 2021-12-07 NOTE — Patient Instructions (Signed)
Raul T Baldridge, thank you for joining Mar Daring, PA-C for today's virtual visit.  While this provider is not your primary care provider (PCP), if your PCP is located in our provider database this encounter information will be shared with them immediately following your visit.  Consent: (Patient) Zachary Turner provided verbal consent for this virtual visit at the beginning of the encounter.  Current Medications:  Current Outpatient Medications:    molnupiravir EUA (LAGEVRIO) 200 mg CAPS capsule, Take 4 capsules (800 mg total) by mouth 2 (two) times daily for 5 days., Disp: 40 capsule, Rfl: 0   ALPRAZolam (XANAX) 0.5 MG tablet, Take 0.5-1 tablets (0.25-0.5 mg total) by mouth at bedtime as needed for anxiety., Disp: 30 tablet, Rfl: 5   fluocinonide cream (LIDEX) 9.83 %, Apply 1 application topically 2 (two) times daily., Disp: , Rfl:    ibuprofen (ADVIL,MOTRIN) 200 MG tablet, Take 400 mg by mouth 2 (two) times daily as needed for pain., Disp: , Rfl:    MAGNESIUM CITRATE PO, Take 1 tablet by mouth daily., Disp: , Rfl:    Multiple Vitamin (MULTIVITAMIN WITH MINERALS) TABS, Take 1 tablet by mouth daily. Spectracite, Disp: , Rfl:    nebivolol (BYSTOLIC) 5 MG tablet, Take 1 tablet (5 mg total) by mouth daily., Disp: 90 tablet, Rfl: 1   Medications ordered in this encounter:  Meds ordered this encounter  Medications   molnupiravir EUA (LAGEVRIO) 200 mg CAPS capsule    Sig: Take 4 capsules (800 mg total) by mouth 2 (two) times daily for 5 days.    Dispense:  40 capsule    Refill:  0    Order Specific Question:   Supervising Provider    Answer:   Sabra Heck, [place]     *If you need refills on other medications prior to your next appointment, please contact your pharmacy*  Follow-Up: Call back or seek an in-person evaluation if the symptoms worsen or if the condition fails to improve as anticipated.  Other Instructions COVID-19: Quarantine and Isolation Quarantine If  you were exposed Quarantine and stay away from others when you have been in close contact with someone who has COVID-19. Isolate If you are sick or test positive Isolate when you are sick or when you have COVID-19, even if you don't have symptoms. When to stay home Calculating quarantine The date of your exposure is considered day 0. Day 1 is the first full day after your last contact with a person who has had COVID-19. Stay home and away from other people for at least 5 days. Learn why CDC updated guidance for the general public. IF YOU were exposed to COVID-19 and are NOT  up to dateIF YOU were exposed to COVID-19 and are NOT on COVID-19 vaccinations Quarantine for at least 5 days Stay home Stay home and quarantine for at least 5 full days. Wear a well-fitting mask if you must be around others in your home. Do not travel. Get tested Even if you don't develop symptoms, get tested at least 5 days after you last had close contact with someone with COVID-19. After quarantine Watch for symptoms Watch for symptoms until 10 days after you last had close contact with someone with COVID-19. Avoid travel It is best to avoid travel until a full 10 days after you last had close contact with someone with COVID-19. If you develop symptoms Isolate immediately and get tested. Continue to stay home until you know the results. Wear a well-fitting mask  around others. Take precautions until day 10 Wear a well-fitting mask Wear a well-fitting mask for 10 full days any time you are around others inside your home or in public. Do not go to places where you are unable to wear a well-fitting mask. If you must travel during days 6-10, take precautions. Avoid being around people who are more likely to get very sick from COVID-19. IF YOU were exposed to COVID-19 and are  up to dateIF YOU were exposed to COVID-19 and are on COVID-19 vaccinations No quarantine You do not need to stay home unless you develop  symptoms. Get tested Even if you don't develop symptoms, get tested at least 5 days after you last had close contact with someone with COVID-19. Watch for symptoms Watch for symptoms until 10 days after you last had close contact with someone with COVID-19. If you develop symptoms Isolate immediately and get tested. Continue to stay home until you know the results. Wear a well-fitting mask around others. Take precautions until day 10 Wear a well-fitting mask Wear a well-fitting mask for 10 full days any time you are around others inside your home or in public. Do not go to places where you are unable to wear a well-fitting mask. Take precautions if traveling Avoid being around people who are more likely to get very sick from COVID-19. IF YOU were exposed to COVID-19 and had confirmed COVID-19 within the past 90 days (you tested positive using a viral test) No quarantine You do not need to stay home unless you develop symptoms. Watch for symptoms Watch for symptoms until 10 days after you last had close contact with someone with COVID-19. If you develop symptoms Isolate immediately and get tested. Continue to stay home until you know the results. Wear a well-fitting mask around others. Take precautions until day 10 Wear a well-fitting mask Wear a well-fitting mask for 10 full days any time you are around others inside your home or in public. Do not go to places where you are unable to wear a well-fitting mask. Take precautions if traveling Avoid being around people who are more likely to get very sick from COVID-19. Calculating isolation Day 0 is your first day of symptoms or a positive viral test. Day 1 is the first full day after your symptoms developed or your test specimen was collected. If you have COVID-19 or have symptoms, isolate for at least 5 days. IF YOU tested positive for COVID-19 or have symptoms, regardless of vaccination status Stay home for at least 5 days Stay home for 5  days and isolate from others in your home. Wear a well-fitting mask if you must be around others in your home. Do not travel. Ending isolation if you had symptoms End isolation after 5 full days if you are fever-free for 24 hours (without the use of fever-reducing medication) and your symptoms are improving. Ending isolation if you did NOT have symptoms End isolation after at least 5 full days after your positive test. If you got very sick from COVID-19 or have a weakened immune system You should isolate for at least 10 days. Consult your doctor before ending isolation. Take precautions until day 10 Wear a well-fitting mask Wear a well-fitting mask for 10 full days any time you are around others inside your home or in public. Do not go to places where you are unable to wear a well-fitting mask. Do not travel Do not travel until a full 10 days after your symptoms started  or the date your positive test was taken if you had no symptoms. Avoid being around people who are more likely to get very sick from COVID-19. Definitions Exposure Contact with someone infected with SARS-CoV-2, the virus that causes COVID-19, in a way that increases the likelihood of getting infected with the virus. Close contact A close contact is someone who was less than 6 feet away from an infected person (laboratory-confirmed or a clinical diagnosis) for a cumulative total of 15 minutes or more over a 24-hour period. For example, three individual 5-minute exposures for a total of 15 minutes. People who are exposed to someone with COVID-19 after they completed at least 5 days of isolation are not considered close contacts. Julio Sicks is a strategy used to prevent transmission of COVID-19 by keeping people who have been in close contact with someone with COVID-19 apart from others. Who does not need to quarantine? If you had close contact with someone with COVID-19 and you are in one of the following groups, you  do not need to quarantine. You are up to date with your COVID-19 vaccines. You had confirmed COVID-19 within the last 90 days (meaning you tested positive using a viral test). If you are up to date with COVID-19 vaccines, you should wear a well-fitting mask around others for 10 days from the date of your last close contact with someone with COVID-19 (the date of last close contact is considered day 0). Get tested at least 5 days after you last had close contact with someone with COVID-19. If you test positive or develop COVID-19 symptoms, isolate from other people and follow recommendations in the Isolation section below. If you tested positive for COVID-19 with a viral test within the previous 90 days and subsequently recovered and remain without COVID-19 symptoms, you do not need to quarantine or get tested after close contact. You should wear a well-fitting mask around others for 10 days from the date of your last close contact with someone with COVID-19 (the date of last close contact is considered day 0). If you have COVID-19 symptoms, get tested and isolate from other people and follow recommendations in the Isolation section below. Who should quarantine? If you come into close contact with someone with COVID-19, you should quarantine if you are not up to date on COVID-19 vaccines. This includes people who are not vaccinated. What to do for quarantine Stay home and away from other people for at least 5 days (day 0 through day 5) after your last contact with a person who has COVID-19. The date of your exposure is considered day 0. Wear a well-fitting mask when around others at home, if possible. For 10 days after your last close contact with someone with COVID-19, watch for fever (100.21F or greater), cough, shortness of breath, or other COVID-19 symptoms. If you develop symptoms, get tested immediately and isolate until you receive your test results. If you test positive, follow isolation  recommendations. If you do not develop symptoms, get tested at least 5 days after you last had close contact with someone with COVID-19. If you test negative, you can leave your home, but continue to wear a well-fitting mask when around others at home and in public until 10 days after your last close contact with someone with COVID-19. If you test positive, you should isolate for at least 5 days from the date of your positive test (if you do not have symptoms). If you do develop COVID-19 symptoms, isolate for at least  5 days from the date your symptoms began (the date the symptoms started is day 0). Follow recommendations in the isolation section below. If you are unable to get a test 5 days after last close contact with someone with COVID-19, you can leave your home after day 5 if you have been without COVID-19 symptoms throughout the 5-day period. Wear a well-fitting mask for 10 days after your date of last close contact when around others at home and in public. Avoid people who are have weakened immune systems or are more likely to get very sick from COVID-19, and nursing homes and other high-risk settings, until after at least 10 days. If possible, stay away from people you live with, especially people who are at higher risk for getting very sick from COVID-19, as well as others outside your home throughout the full 10 days after your last close contact with someone with COVID-19. If you are unable to quarantine, you should wear a well-fitting mask for 10 days when around others at home and in public. If you are unable to wear a mask when around others, you should continue to quarantine for 10 days. Avoid people who have weakened immune systems or are more likely to get very sick from COVID-19, and nursing homes and other high-risk settings, until after at least 10 days. See additional information about travel. Do not go to places where you are unable to wear a mask, such as restaurants and some gyms,  and avoid eating around others at home and at work until after 10 days after your last close contact with someone with COVID-19. After quarantine Watch for symptoms until 10 days after your last close contact with someone with COVID-19. If you have symptoms, isolate immediately and get tested. Quarantine in high-risk congregate settings In certain congregate settings that have high risk of secondary transmission (such as Systems analyst and detention facilities, homeless shelters, or cruise ships), CDC recommends a 10-day quarantine for residents, regardless of vaccination and booster status. During periods of critical staffing shortages, facilities may consider shortening the quarantine period for staff to ensure continuity of operations. Decisions to shorten quarantine in these settings should be made in consultation with state, local, tribal, or territorial health departments and should take into consideration the context and characteristics of the facility. CDC's setting-specific guidance provides additional recommendations for these settings. Isolation Isolation is used to separate people with confirmed or suspected COVID-19 from those without COVID-19. People who are in isolation should stay home until it's safe for them to be around others. At home, anyone sick or infected should separate from others, or wear a well-fitting mask when they need to be around others. People in isolation should stay in a specific "sick room" or area and use a separate bathroom if available. Everyone who has presumed or confirmed COVID-19 should stay home and isolate from other people for at least 5 full days (day 0 is the first day of symptoms or the date of the day of the positive viral test for asymptomatic persons). They should wear a mask when around others at home and in public for an additional 5 days. People who are confirmed to have COVID-19 or are showing symptoms of COVID-19 need to isolate regardless of their  vaccination status. This includes: People who have a positive viral test for COVID-19, regardless of whether or not they have symptoms. People with symptoms of COVID-19, including people who are awaiting test results or have not been tested. People with symptoms should  isolate even if they do not know if they have been in close contact with someone with COVID-19. What to do for isolation Monitor your symptoms. If you have an emergency warning sign (including trouble breathing), seek emergency medical care immediately. Stay in a separate room from other household members, if possible. Use a separate bathroom, if possible. Take steps to improve ventilation at home, if possible. Avoid contact with other members of the household and pets. Don't share personal household items, like cups, towels, and utensils. Wear a well-fitting mask when you need to be around other people. Learn more about what to do if you are sick and how to notify your contacts. Ending isolation for people who had COVID-19 and had symptoms If you had COVID-19 and had symptoms, isolate for at least 5 days. To calculate your 5-day isolation period, day 0 is your first day of symptoms. Day 1 is the first full day after your symptoms developed. You can leave isolation after 5 full days. You can end isolation after 5 full days if you are fever-free for 24 hours without the use of fever-reducing medication and your other symptoms have improved (Loss of taste and smell may persist for weeks or months after recovery and need not delay the end of isolation). You should continue to wear a well-fitting mask around others at home and in public for 5 additional days (day 6 through day 10) after the end of your 5-day isolation period. If you are unable to wear a mask when around others, you should continue to isolate for a full 10 days. Avoid people who have weakened immune systems or are more likely to get very sick from COVID-19, and nursing homes  and other high-risk settings, until after at least 10 days. If you continue to have fever or your other symptoms have not improved after 5 days of isolation, you should wait to end your isolation until you are fever-free for 24 hours without the use of fever-reducing medication and your other symptoms have improved. Continue to wear a well-fitting mask through day 10. Contact your healthcare provider if you have questions. See additional information about travel. Do not go to places where you are unable to wear a mask, such as restaurants and some gyms, and avoid eating around others at home and at work until a full 10 days after your first day of symptoms. If an individual has access to a test and wants to test, the best approach is to use an antigen test1 towards the end of the 5-day isolation period. Collect the test sample only if you are fever-free for 24 hours without the use of fever-reducing medication and your other symptoms have improved (loss of taste and smell may persist for weeks or months after recovery and need not delay the end of isolation). If your test result is positive, you should continue to isolate until day 10. If your test result is negative, you can end isolation, but continue to wear a well-fitting mask around others at home and in public until day 10. Follow additional recommendations for masking and avoiding travel as described above. 1As noted in the labeling for authorized over-the counter antigen tests: Negative results should be treated as presumptive. Negative results do not rule out SARS-CoV-2 infection and should not be used as the sole basis for treatment or patient management decisions, including infection control decisions. To improve results, antigen tests should be used twice over a three-day period with at least 24 hours and no  more than 48 hours between tests. Note that these recommendations on ending isolation do not apply to people who are moderately ill or very  sick from COVID-19 or have weakened immune systems. See section below for recommendations for when to end isolation for these groups. Ending isolation for people who tested positive for COVID-19 but had no symptoms If you test positive for COVID-19 and never develop symptoms, isolate for at least 5 days. Day 0 is the day of your positive viral test (based on the date you were tested) and day 1 is the first full day after the specimen was collected for your positive test. You can leave isolation after 5 full days. If you continue to have no symptoms, you can end isolation after at least 5 days. You should continue to wear a well-fitting mask around others at home and in public until day 10 (day 6 through day 10). If you are unable to wear a mask when around others, you should continue to isolate for 10 days. Avoid people who have weakened immune systems or are more likely to get very sick from COVID-19, and nursing homes and other high-risk settings, until after at least 10 days. If you develop symptoms after testing positive, your 5-day isolation period should start over. Day 0 is your first day of symptoms. Follow the recommendations above for ending isolation for people who had COVID-19 and had symptoms. See additional information about travel. Do not go to places where you are unable to wear a mask, such as restaurants and some gyms, and avoid eating around others at home and at work until 10 days after the day of your positive test. If an individual has access to a test and wants to test, the best approach is to use an antigen test1 towards the end of the 5-day isolation period. If your test result is positive, you should continue to isolate until day 10. If your test result is positive, you can also choose to test daily and if your test result is negative, you can end isolation, but continue to wear a well-fitting mask around others at home and in public until day 10. Follow additional recommendations  for masking and avoiding travel as described above. 1As noted in the labeling for authorized over-the counter antigen tests: Negative results should be treated as presumptive. Negative results do not rule out SARS-CoV-2 infection and should not be used as the sole basis for treatment or patient management decisions, including infection control decisions. To improve results, antigen tests should be used twice over a three-day period with at least 24 hours and no more than 48 hours between tests. Ending isolation for people who were moderately or very sick from COVID-19 or have a weakened immune system People who are moderately ill from COVID-19 (experiencing symptoms that affect the lungs like shortness of breath or difficulty breathing) should isolate for 10 days and follow all other isolation precautions. To calculate your 10-day isolation period, day 0 is your first day of symptoms. Day 1 is the first full day after your symptoms developed. If you are unsure if your symptoms are moderate, talk to a healthcare provider for further guidance. People who are very sick from COVID-19 (this means people who were hospitalized or required intensive care or ventilation support) and people who have weakened immune systems might need to isolate at home longer. They may also require testing with a viral test to determine when they can be around others. CDC recommends an isolation period  of at least 10 and up to 20 days for people who were very sick from COVID-19 and for people with weakened immune systems. Consult with your healthcare provider about when you can resume being around other people. If you are unsure if your symptoms are severe or if you have a weakened immune system, talk to a healthcare provider for further guidance. People who have a weakened immune system should talk to their healthcare provider about the potential for reduced immune responses to COVID-19 vaccines and the need to continue to follow  current prevention measures (including wearing a well-fitting mask and avoiding crowds and poorly ventilated indoor spaces) to protect themselves against COVID-19 until advised otherwise by their healthcare provider. Close contacts of immunocompromised people--including household members--should also be encouraged to receive all recommended COVID-19 vaccine doses to help protect these people. Isolation in high-risk congregate settings In certain high-risk congregate settings that have high risk of secondary transmission and where it is not feasible to cohort people (such as Systems analyst and detention facilities, homeless shelters, and cruise ships), CDC recommends a 10-day isolation period for residents. During periods of critical staffing shortages, facilities may consider shortening the isolation period for staff to ensure continuity of operations. Decisions to shorten isolation in these settings should be made in consultation with state, local, tribal, or territorial health departments and should take into consideration the context and characteristics of the facility. CDC's setting-specific guidance provides additional recommendations for these settings. This CDC guidance is meant to supplement--not replace--any federal, state, local, territorial, or tribal health and safety laws, rules, and regulations. Recommendations for specific settings These recommendations do not apply to healthcare professionals. For guidance specific to these settings, see Healthcare professionals: Interim Guidance for Optician, dispensing with SARS-CoV-2 Infection or Exposure to SARS-CoV-2 Patients, residents, and visitors to healthcare settings: Interim Infection Prevention and Control Recommendations for Healthcare Personnel During the [place] 2019 (COVID-19) Pandemic Additional setting-specific guidance and recommendations are available. These recommendations on quarantine and isolation do apply to [place] settings. Additional guidance is available here: Overview of COVID-19 Quarantine for K-12 Schools Travelers: Travel information and recommendations Congregate facilities and other settings: [employer] for community, work, and school settings Ongoing COVID-19 exposure FAQs I live with someone with COVID-19, but I cannot be separated from them. How do we manage quarantine in this situation? It is very important for people with COVID-19 to remain apart from other people, if possible, even if they are living together. If separation of the person with COVID-19 from others that they live with is not possible, the other people that they live with will have ongoing exposure, meaning they will be repeatedly exposed until that person is no longer able to spread the virus to other people. In this situation, there are precautions you can take to limit the spread of COVID-19: The person with COVID-19 and everyone they live with should wear a well-fitting mask inside the home. If possible, one person should care for the person with COVID-19 to limit the number of people who are in close contact with the infected person. Take steps to protect yourself and others to reduce transmission in the home: Quarantine if you are not up to date with your COVID-19 vaccines. Isolate if you are sick or tested positive for COVID-19, even if you don't have symptoms. Learn more about the public health recommendations for testing, mask use and quarantine of close contacts, like yourself, who have ongoing exposure. These recommendations differ depending on your vaccination  status. What should I do if I have ongoing exposure to COVID-19 from someone I live with? Recommendations for this situation depend on your vaccination status: If you are not up to date on COVID-19 vaccines and have ongoing exposure to COVID-19, you should: Begin quarantine immediately and continue to quarantine throughout the isolation period of the  person with COVID-19. Continue to quarantine for an additional 5 days starting the day after the end of isolation for the person with COVID-19. Get tested at least 5 days after the end of isolation of the infected person that lives with them. If you test negative, you can leave the home but should continue to wear a well-fitting mask when around others at home and in public until 10 days after the end of isolation for the person with COVID-19. Isolate immediately if you develop symptoms of COVID-19 or test positive. If you are up to date with COVID-19 vaccines and have ongoing exposure to COVID-19, you should: Get tested at least 5 days after your first exposure. A person with COVID-19 is considered infectious starting 2 days before they develop symptoms, or 2 days before the date of their positive test if they do not have symptoms. Get tested again at least 5 days after the end of isolation for the person with COVID-19. Wear a well-fitting mask when you are around the person with COVID-19, and do this throughout their isolation period. Wear a well-fitting mask around others for 10 days after the infected person's isolation period ends. Isolate immediately if you develop symptoms of COVID-19 or test positive. What should I do if multiple people I live with test positive for COVID-19 at different times? Recommendations for this situation depend on your vaccination status: If you are not up to date with your COVID-19 vaccines, you should: Quarantine throughout the isolation period of any infected person that you live with. Continue to quarantine until 5 days after the end of isolation date for the most recently infected person that lives with you. For example, if the last day of isolation of the person most recently infected with COVID-19 was June 30, the new 5-day quarantine period starts on July 1. Get tested at least 5 days after the end of isolation for the most recently infected person that lives  with you. Wear a well-fitting mask when you are around any person with COVID-19 while that person is in isolation. Wear a well-fitting mask when you are around other people until 10 days after your last close contact. Isolate immediately if you develop symptoms of COVID-19 or test positive. If you are up to date with your COVID-19 vaccines, you should: Get tested at least 5 days after your first exposure. A person with COVID-19 is considered infectious starting 2 days before they developed symptoms, or 2 days before the date of their positive test if they do not have symptoms. Get tested again at least 5 days after the end of isolation for the most recently infected person that lives with you. Wear a well-fitting mask when you are around any person with COVID-19 while that person is in isolation. Wear a well-fitting mask around others for 10 days after the end of isolation for the most recently infected person that lives with you. For example, if the last day of isolation for the person most recently infected with COVID-19 was June 30, the new 10-day period to wear a well-fitting mask indoors in public starts on July 1. Isolate immediately if you develop symptoms of COVID-19  or test positive. I had COVID-19 and completed isolation. Do I have to quarantine or get tested if someone I live with gets COVID-19 shortly after I completed isolation? No. If you recently completed isolation and someone that lives with you tests positive for the virus that causes COVID-19 shortly after the end of your isolation period, you do not have to quarantine or get tested as long as you do not develop new symptoms. Once all of the people that live together have completed isolation or quarantine, refer to the guidance below for new exposures to COVID-19. If you had COVID-19 in the previous 90 days and then came into close contact with someone with COVID-19, you do not have to quarantine or get tested if you do not have  symptoms. But you should: Wear a well-fitting mask indoors in public for 10 days after your last close contact. Monitor for COVID-19 symptoms for 10 days from the date of your last close contact. Isolate immediately and get tested if symptoms develop. If more than 90 days have passed since your recovery from infection, follow CDC's recommendations for close contacts. These recommendations will differ depending on your vaccination status. 01/26/2021 Content source: [facility] ([facility]), Division of Viral Diseases This information is not intended to replace advice given to you by your health care provider. Make sure you discuss any questions you have with your health care provider. Document Revised: 06/01/2021 Document Reviewed: 06/01/2021 Elsevier Patient Education  2022 [employer].    If you have been instructed to have an in-person evaluation today at a local Urgent Care facility, please use the link below. It will take you to a list of all of our available [facility], including address, phone number and hours of operation. Please do not delay care.  [facility]  If you or a family member do not have a primary care provider, use the link below to schedule a visit and establish care. When you choose a [facility] primary care physician or advanced practice provider, you gain a long-term partner in health. Find a Primary Care Provider  Learn more about 's in-office and virtual care options: [facility]

## 2021-12-07 NOTE — Progress Notes (Signed)
Virtual Visit Consent   Zachary Turner, you are scheduled for a virtual visit with a [facility] provider today.     Just as with appointments in the office, your consent must be obtained to participate.  Your consent will be active for this visit and any virtual visit you may have with one of our providers in the next 365 days.     If you have a MyChart account, a copy of this consent can be sent to you electronically.  All virtual visits are billed to your insurance company just like a traditional visit in the office.    As this is a virtual visit, video technology does not allow for your provider to perform a traditional examination.  This may limit your provider's ability to fully assess your condition.  If your provider identifies any concerns that need to be evaluated in person or the need to arrange testing (such as labs, EKG, etc.), we will make arrangements to do so.     Although advances in technology are sophisticated, we cannot ensure that it will always work on either your end or our end.  If the connection with a video visit is poor, the visit may have to be switched to a telephone visit.  With either a video or telephone visit, we are not always able to ensure that we have a secure connection.     I need to obtain your verbal consent now.   Are you willing to proceed with your visit today?    Zachary Turner has provided verbal consent on 12/07/2021 for a virtual visit (video or telephone).   Mar Daring, PA-C   Date: 12/07/2021 11:32 AM   Virtual Visit via Video Note   I, Mar Daring, connected with  Zachary Turner  ([id number], 03-09-1946) on 12/07/21 at 10:45 AM EST by a video-enabled telemedicine application and verified that I am speaking with the correct person using two identifiers.  Location: Patient: Virtual Visit Location Patient: Home Provider: Virtual Visit Location Provider: Home Office   I discussed the limitations of evaluation  and management by telemedicine and the availability of in person appointments. The patient expressed understanding and agreed to proceed.    History of Present Illness: Zachary Turner is a 76 y.o. who identifies as a male who was assigned male at birth, and is being seen today for covid 7.  HPI: URI  This is a new problem. The current episode started in the past 7 days (tested positive for covid 19 this morning; symptoms started Sunday night). The problem has been gradually worsening. There has been no fever. Associated symptoms include congestion, coughing, headaches, rhinorrhea, sinus pain, sneezing and a sore throat (scratchy). Pertinent negatives include no diarrhea, ear pain, nausea, plugged ear sensation or vomiting. Associated symptoms comments: Post nasal drainage. He has tried sleep (mucinex sinus max) for the symptoms. The treatment provided mild relief.     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   Leg cramping 05/15/2019   Elevated TSH 05/15/2019   Essential hypertension 02/26/2019   Bicipital tendonitis of right shoulder 12/26/2017   Incomplete tear of left rotator cuff 11/15/2017   Arthritis of both glenohumeral joints 11/15/2017   HYPERPLASIA PROSTATE UNS W/O UR OBST & OTH LUTS 10/13/2009    Allergies: No Known Allergies Medications:  Current Outpatient Medications:    molnupiravir EUA (LAGEVRIO) 200  mg CAPS capsule, Take 4 capsules (800 mg total) by mouth 2 (two) times daily for 5 days., Disp: 40 capsule, Rfl: 0   ALPRAZolam (XANAX) 0.5 MG tablet, Take 0.5-1 tablets (0.25-0.5 mg total) by mouth at bedtime as needed for anxiety., Disp: 30 tablet, Rfl: 5   fluocinonide cream (LIDEX) 4.54 %, Apply 1 application topically 2 (two) times daily., Disp: , Rfl:    ibuprofen (ADVIL,MOTRIN) 200 MG tablet, Take 400 mg by mouth 2 (two) times daily as needed for pain., Disp: , Rfl:     MAGNESIUM CITRATE PO, Take 1 tablet by mouth daily., Disp: , Rfl:    Multiple Vitamin (MULTIVITAMIN WITH MINERALS) TABS, Take 1 tablet by mouth daily. Spectracite, Disp: , Rfl:    nebivolol (BYSTOLIC) 5 MG tablet, Take 1 tablet (5 mg total) by mouth daily., Disp: 90 tablet, Rfl: 1  Observations/Objective: Patient is well-developed, well-nourished in no acute distress.  Resting comfortably at home.  Head is normocephalic, atraumatic.  No labored breathing.  Speech is clear and coherent with logical content.  Patient is alert and oriented at baseline.    Assessment and Plan: 1. COVID-19 - MyChart COVID-19 home monitoring program; Future - molnupiravir EUA (LAGEVRIO) 200 mg CAPS capsule; Take 4 capsules (800 mg total) by mouth 2 (two) times daily for 5 days.  Dispense: 40 capsule; Refill: 0  - Continue OTC symptomatic management of choice - Will send OTC vitamins and supplement information through AVS - Molnupiravir prescribed - Patient enrolled in MyChart symptom monitoring - Push fluids - Rest as needed - Discussed return precautions and when to seek in-person evaluation, sent via AVS as well  Follow Up Instructions: I discussed the assessment and treatment plan with the patient. The patient was provided an opportunity to ask questions and all were answered. The patient agreed with the plan and demonstrated an understanding of the instructions.  A copy of instructions were sent to the patient via MyChart unless otherwise noted below.    The patient was advised to call back or seek an in-person evaluation if the symptoms worsen or if the condition fails to improve as anticipated.  Time:  I spent 19 minutes with the patient via telehealth technology discussing the above problems/concerns.    Mar Daring, PA-C

## 2021-12-08 ENCOUNTER — Telehealth: Payer: Medicare HMO | Admitting: Family Medicine

## 2022-02-23 ENCOUNTER — Other Ambulatory Visit: Payer: Self-pay | Admitting: Internal Medicine

## 2022-04-10 ENCOUNTER — Ambulatory Visit (INDEPENDENT_AMBULATORY_CARE_PROVIDER_SITE_OTHER): Payer: Medicare HMO

## 2022-04-10 VITALS — Wt 160.0 lb

## 2022-04-10 DIAGNOSIS — Z0000 Encounter for general adult medical examination without abnormal findings: Secondary | ICD-10-CM

## 2022-04-10 NOTE — Patient Instructions (Addendum)
Please continue to do your personal lifestyle choices by: daily care of teeth and gums, regular physical activity (goal should be 5 days a week for 30 minutes), eat a healthy diet, avoid tobacco and drug use, limiting any alcohol intake, taking a low-dose aspirin (if not allergic or have been advised by your provider otherwise) and taking vitamins and minerals as recommended by your provider. Continue doing brain stimulating activities (puzzles, reading, adult coloring books, staying active) to keep memory sharp. Continue to eat heart healthy diet (full of fruits, vegetables, whole grains, lean protein, water--limit salt, fat, and sugar intake) and increase physical activity as tolerated.  I enjoyed speaking with you today!  Jarrett Soho, [place]

## 2022-04-10 NOTE — Progress Notes (Signed)
Subjective:   Zachary Turner is a 76 y.o. male who presents for an Initial Medicare Annual Wellness Visit.  I connected with Faye today by telephone and verified that I am speaking with the correct person using two identifiers. I discussed the limitations, risks, security and privacy concerns of performing an evaluation and management service by telephone and the availability of in person appointments. I also discussed with the patient that there may be a patient responsible charge related to this service. The patient expressed understanding and agreed to proceed.  Location patient: home Location provider: office  Review of Systems    No ROS. Medicare Wellness Telephone Visit.      Objective:    Today's Vitals   04/10/22 1409  Weight: 160 lb (72.6 kg)   Body mass index is 22 kg/m.      No data to display          Current Medications (verified) Outpatient Encounter Medications as of 04/10/2022  Medication Sig   ALPRAZolam (XANAX) 0.5 MG tablet TAKE 0.5-1 TABLETS (0.25-0.5 MG TOTAL) BY MOUTH AT BEDTIME AS NEEDED FOR ANXIETY.   fluocinonide cream (LIDEX) 5.68 % Apply 1 application topically 2 (two) times daily.   ibuprofen (ADVIL,MOTRIN) 200 MG tablet Take 400 mg by mouth 2 (two) times daily as needed for pain.   MAGNESIUM CITRATE PO Take 1 tablet by mouth daily.   Multiple Vitamin (MULTIVITAMIN WITH MINERALS) TABS Take 1 tablet by mouth daily. Spectracite   nebivolol (BYSTOLIC) 5 MG tablet Take 1 tablet (5 mg total) by mouth daily.   TURMERIC PO Take 100 mg by mouth daily.   No facility-administered encounter medications on file as of 04/10/2022.    Allergies (verified) Patient has no known allergies.   History: Past Medical History:  Diagnosis Date   Hypertension    Past Surgical History:  Procedure Laterality Date   HERNIA REPAIR     REPLACEMENT TOTAL KNEE     TONSILLECTOMY     Family History  Problem Relation Age of Onset   Hypertension Mother     Hypertension Father    Obesity Sister    Atrial fibrillation Sister    Heart disease Brother    Obesity Sister    Social History   Socioeconomic History   Marital status: Married    Spouse name: Not on file   Number of children: Not on file   Years of education: Not on file   Highest education level: Not on file  Occupational History   Not on file  Tobacco Use   Smoking status: Never   Smokeless tobacco: Never  Substance and Sexual Activity   Alcohol use: Yes    Alcohol/week: 1.0 standard drink of alcohol    Types: 1 Glasses of wine per week    Comment: Socially   Drug use: No   Sexual activity: Not on file  Other Topics Concern   Not on file  Social History Narrative   Not on file   Social Determinants of Health   Financial Resource Strain: Low Risk  (04/10/2022)   Overall Financial Resource Strain (CARDIA)    Difficulty of Paying Living Expenses: Not hard at all  Food Insecurity: No Food Insecurity (04/10/2022)   Hunger Vital Sign    Worried About Running Out of Food in the Last Year: Never true    Ran Out of Food in the Last Year: Never true  Transportation Needs: No Transportation Needs (04/10/2022)   PRAPARE - Transportation  Lack of Transportation (Medical): No    Lack of Transportation (Non-Medical): No  Physical Activity: Sufficiently Active (04/10/2022)   Exercise Vital Sign    Days of Exercise per Week: 4 days    Minutes of Exercise per Session: 60 min  Stress: No Stress Concern Present (04/10/2022)   [place]    Feeling of Stress : Only a little  Social Connections: Moderately Integrated (04/10/2022)   Social Connection and Isolation Panel [NHANES]    Frequency of Communication with Friends and Family: More than three times a week    Frequency of Social Gatherings with Friends and Family: More than three times a week    Attends Religious Services: More than 4 times per year    Active  Member of [employer] or Organizations: Yes    Attends Archivist Meetings: More than 4 times per year    Marital Status: Widowed    Tobacco Counseling N/A  Clinical Intake:  Pre-visit preparation completed: Yes  Pain : No/denies pain    Nutritional Status: BMI of 19-24  Normal Nutritional Risks: None Diabetes: No  How often do you need to have someone help you when you read instructions, pamphlets, or other written materials from your doctor or pharmacy?: 1 - Never What is the last grade level you completed in school?: Masters - college  Diabetic?no  Interpreter Needed?: No  Information entered by :: Jillene Bucks, CMA   Activities of Daily Living    04/10/2022    2:39 PM 08/23/2021    9:37 AM  In your present state of health, do you have any difficulty performing the following activities:  Hearing? 0 0  Vision? 0 0  Difficulty concentrating or making decisions? 0 0  Walking or climbing stairs? 0 0  Dressing or bathing? 0 0  Doing errands, shopping? 0 0    Patient Care Team: Binnie Rail, MD as PCP - General (Internal Medicine)  Indicate any recent Medical Services you may have received from other than [facility] providers in the past year (date may be approximate).     Assessment:   This is a routine wellness examination for Zachary Turner.  Hearing/Vision screen No results found.  Dietary issues and exercise activities discussed:  He enjoys exercising and will either play tennis, ride his bike or use his peloton machine.   Goals Addressed               This Visit's Progress     Patient Stated (pt-stated)        Patient stated he would like to continue staying active and exercising to stay healthy.       Depression Screen    04/10/2022    2:34 PM 07/21/2020    3:44 PM 02/26/2019    9:17 AM  PHQ 2/9 Scores  PHQ - 2 Score 0 0 0    Fall Risk    04/10/2022    2:38 PM 08/23/2021    9:36 AM 07/21/2020    3:44 PM 02/26/2019    9:16 AM  [place] in the past year? 0 0 0 0  Number falls in past yr: 0 0 0 0  Injury with Fall? 0 0 0   Risk for fall due to : No Fall Risks No Fall Risks    Follow up Falls evaluation completed Falls evaluation completed      [place]:  Any  stairs in or around the home? Yes  If so, are there any without handrails? No  Home free of loose throw rugs in walkways, pet beds, electrical cords, etc? Yes  Adequate lighting in your home to reduce risk of falls? Yes   ASSISTIVE DEVICES UTILIZED TO PREVENT FALLS:  Life alert? No but has Alexa Use of a cane, walker or w/c? No  Grab bars in the bathroom? Yes  Shower chair or bench in shower? Yes , bench Elevated toilet seat or a handicapped toilet? No   TIMED UP AND GO:  Was the test performed? No this was a virtual visit.  Length of time to ambulate 10 feet: N/A   Immunizations Immunization History  Administered Date(s) Administered   Fluad Quad(high Dose 65+) 06/30/2019, 07/21/2020   Influenza Whole 07/12/2008, 10/15/2008, 08/12/2009, 08/09/2010   Influenza, High Dose Seasonal PF 07/26/2017, 07/22/2018   PFIZER Comirnaty(Gray Top)Covid-19 Tri-Sucrose Vaccine 01/18/2021   PFIZER(Purple Top)SARS-COV-2 Vaccination 11/14/2019, 12/05/2019, 06/28/2020   Pfizer Covid-19 Vaccine Bivalent Booster 20yr & up 07/11/2021   Pneumococcal Conjugate-13 07/24/2014   Pneumococcal Polysaccharide-23 09/11/2011, 08/05/2020   Zoster, Live 11/11/2012    TDAP status: Up to date per patient but unable to find in [employer]  Flu Vaccine status: Declined, Education has been provided regarding the importance of this vaccine but patient still declined. Advised may receive this vaccine at local pharmacy or Health Dept. Aware to provide a copy of the vaccination record if obtained from local pharmacy or Health Dept. Verbalized acceptance and understanding.  Pneumococcal vaccine status: Up to date  Covid-19 vaccine status:  Completed vaccines  Qualifies for Shingles Vaccine? Yes  patient states he has had the 1st shot at one point a long time ago, never the 2nd, but it is not able to be found on [facility]. Zostavax completed No   Shingrix Completed?: No.    Education has been provided regarding the importance of this vaccine. Patient has been advised to call insurance company to determine out of pocket expense if they have not yet received this vaccine. Advised may also receive vaccine at local pharmacy or Health Dept. Verbalized acceptance and understanding.  Screening Tests Health Maintenance  Topic Date Due   COVID-19 Vaccine (6 - Pfizer series) 04/26/2022 (Originally 11/10/2021)   Zoster Vaccines- Shingrix (1 of 2) 07/11/2022 (Originally 12/24/1994)   TETANUS/TDAP  04/11/2023 (Originally [birth year]25/1964)   Hepatitis C Screening  04/11/2023 (Originally 12/24/1963)   INFLUENZA VACCINE  05/30/2022   Pneumonia Vaccine 76 Years old  Completed   HPV VACCINES  Aged Out    Health Maintenance  There are no preventive care reminders to display for this patient.   Colorectal cancer screening: No longer required.   Lung Cancer Screening: (Low Dose CT Chest recommended if Age 602-80years, 30 pack-year currently smoking OR have quit w/in 15years.) does not qualify.   Lung Cancer Screening Referral: N/A  Additional Screening:  Hepatitis C Screening: does qualify; Completed N/A  Vision Screening: Recommended annual ophthalmology exams for early detection of glaucoma and other disorders of the eye. Is the patient up to date with their annual eye exam?  Yes  Who is the provider or what is the name of the office in which the patient attends annual eye exams? [facility] [address] If pt is not established with a provider, would they like to be referred to a provider to establish care? No .   Dental Screening: Recommended annual dental exams for proper oral hygiene  [employer]  Referral / Chronic Care Management: CRR  required this visit?  No   CCM required this visit?  No      Plan:     I have personally reviewed and noted the following in the patient's chart:   Medical and social history Use of alcohol, tobacco or illicit drugs  Current medications and supplements including opioid prescriptions. Patient is currently taking opioid prescriptions. Information provided to patient regarding non-opioid alternatives. Patient advised to discuss non-opioid treatment plan with their provider. Functional ability and status Nutritional status Physical activity Advanced directives List of other physicians Hospitalizations, surgeries, and ER visits in previous 12 months Vitals Screenings to include cognitive, depression, and falls Referrals and appointments  In addition, I have reviewed and discussed with patient certain preventive protocols, quality metrics, and best practice recommendations. A written personalized care plan for preventive services as well as general preventive health recommendations were provided to patient.     Rossie Muskrat, CMA   04/10/2022   Nurse Notes:   Time Spent with patient on telephone encounter: 20 mins  Patient is cogitatively intact. Reviewed health maintenance screenings with patient today and relevant education, vaccines, and/or referrals were provided.

## 2022-04-26 DIAGNOSIS — L570 Actinic keratosis: Secondary | ICD-10-CM | POA: Diagnosis not present

## 2022-04-26 DIAGNOSIS — L812 Freckles: Secondary | ICD-10-CM | POA: Diagnosis not present

## 2022-04-26 DIAGNOSIS — Z85828 Personal history of other malignant neoplasm of skin: Secondary | ICD-10-CM | POA: Diagnosis not present

## 2022-04-26 DIAGNOSIS — L820 Inflamed seborrheic keratosis: Secondary | ICD-10-CM | POA: Diagnosis not present

## 2022-04-26 DIAGNOSIS — L821 Other seborrheic keratosis: Secondary | ICD-10-CM | POA: Diagnosis not present

## 2022-06-18 ENCOUNTER — Other Ambulatory Visit: Payer: Self-pay | Admitting: Internal Medicine

## 2022-07-12 ENCOUNTER — Other Ambulatory Visit: Payer: Self-pay | Admitting: Internal Medicine

## 2022-08-03 ENCOUNTER — Other Ambulatory Visit: Payer: Self-pay | Admitting: Internal Medicine

## 2022-08-13 ENCOUNTER — Encounter: Payer: Self-pay | Admitting: Internal Medicine

## 2022-08-13 DIAGNOSIS — R739 Hyperglycemia, unspecified: Secondary | ICD-10-CM | POA: Insufficient documentation

## 2022-08-13 NOTE — Progress Notes (Unsigned)
Subjective:    Patient ID: Zachary Turner, male    DOB: [birth year], 76 y.o.   MRN: [id number]     HPI Jaquel is here for a physical exam.   Doing well overall.  No concerns.     Medications and allergies reviewed with patient and updated if appropriate.  Current Outpatient Medications on File Prior to Visit  Medication Sig Dispense Refill   ALPRAZolam (XANAX) 0.5 MG tablet TAKE 0.5-1 TABLETS (0.25-0.5 MG TOTAL) BY MOUTH AT BEDTIME AS NEEDED FOR ANXIETY. 30 tablet 3   fluocinonide cream (LIDEX) 3.79 % Apply 1 application topically 2 (two) times daily.     ibuprofen (ADVIL,MOTRIN) 200 MG tablet Take 400 mg by mouth 2 (two) times daily as needed for pain.     MAGNESIUM CITRATE PO Take 1 tablet by mouth daily.     Multiple Vitamin (MULTIVITAMIN WITH MINERALS) TABS Take 1 tablet by mouth daily. Spectracite     nebivolol (BYSTOLIC) 5 MG tablet TAKE 1 TABLET (5 MG TOTAL) BY MOUTH DAILY. 90 tablet 1   TURMERIC PO Take 100 mg by mouth daily.     No current facility-administered medications on file prior to visit.    Review of Systems  Constitutional:  Negative for chills and fever.  Eyes:  Negative for visual disturbance.  Respiratory:  Negative for cough, shortness of breath and wheezing.   Cardiovascular:  Negative for chest pain, palpitations and leg swelling.  Gastrointestinal:  Negative for abdominal pain, blood in stool, constipation, diarrhea and nausea.       No gerd  Genitourinary:  Negative for difficulty urinating, dysuria and hematuria.  Musculoskeletal:  Negative for arthralgias. Back pain: occ stiffness in back. Skin:  Negative for rash.  Neurological:  Negative for light-headedness and headaches.  Psychiatric/Behavioral:  Negative for dysphoric mood. The patient is not nervous/anxious.        Objective:   Vitals:   08/14/22 1304 08/14/22 1350  BP: (!) 146/70 138/80  Pulse: (!) 58   Temp: 98.2 F (36.8 C)   SpO2: 97%    Filed Weights   08/14/22  1304  Weight: 163 lb (73.9 kg)   Body mass index is 22.42 kg/m.  BP Readings from Last 3 Encounters:  08/14/22 138/80  08/23/21 (!) 142/80  07/21/20 (!) 142/78    Wt Readings from Last 3 Encounters:  08/14/22 163 lb (73.9 kg)  04/10/22 160 lb (72.6 kg)  08/23/21 159 lb (72.1 kg)      Physical Exam Constitutional: He appears well-developed and well-nourished. No distress.  HENT:  Head: Normocephalic and atraumatic.  Right Ear: External ear normal.  Left Ear: External ear normal.  Mouth/Throat: Oropharynx is clear and moist.  Normal ear canals and TM b/l  Eyes: Conjunctivae and EOM are normal.  Neck: Neck supple. No tracheal deviation present. No thyromegaly present.  No carotid bruit  Cardiovascular: Normal rate, regular rhythm, normal heart sounds and intact distal pulses.   No murmur heard. Pulmonary/Chest: Effort normal and breath sounds normal. No respiratory distress. He has no wheezes. He has no rales.  Abdominal: Soft. He exhibits no distension. There is no tenderness.  Genitourinary: deferred  Musculoskeletal: He exhibits no edema.  Lymphadenopathy:   He has no cervical adenopathy.  Skin: Skin is warm and dry. He is not diaphoretic.  Psychiatric: He has a normal mood and affect. His behavior is normal.         Assessment & Plan:   Physical exam: Screening  blood work  ordered Exercise   regular - tennis, working out [employer] normal Substance abuse   none   Reviewed recommended immunizations.   Health Maintenance  Topic Date Due   Zoster Vaccines- Shingrix (1 of 2) 11/14/2022 (Originally 12/24/1994)   TETANUS/TDAP  04/11/2023 (Originally 12/25/1963)   Hepatitis C Screening  04/11/2023 (Originally 12/24/1962)   COVID-19 Vaccine (6 - Pfizer series) 11/25/2022   Pneumonia Vaccine 7+ Years old  Completed   INFLUENZA VACCINE  Completed   HPV VACCINES  Aged Out     See Problem List for Assessment and Plan of chronic medical problems.

## 2022-08-13 NOTE — Patient Instructions (Addendum)
Blood work was ordered.     Medications changes include :   none    Return in about 1 year (around 08/15/2023) for Physical Exam.   Health Maintenance, Male Adopting a healthy lifestyle and getting preventive care are important in promoting health and wellness. Ask your health care provider about: The right schedule for you to have regular tests and exams. Things you can do on your own to prevent diseases and keep yourself healthy. What should I know about diet, weight, and exercise? Eat a healthy diet  Eat a diet that includes plenty of vegetables, fruits, low-fat dairy products, and lean protein. Do not eat a lot of foods that are high in solid fats, added sugars, or sodium. Maintain a healthy weight Body mass index (BMI) is a measurement that can be used to identify possible weight problems. It estimates body fat based on height and weight. Your health care provider can help determine your BMI and help you achieve or maintain a healthy weight. Get regular exercise Get regular exercise. This is one of the most important things you can do for your health. Most adults should: Exercise for at least 150 minutes each week. The exercise should increase your heart rate and make you sweat (moderate-intensity exercise). Do strengthening exercises at least twice a week. This is in addition to the moderate-intensity exercise. Spend less time sitting. Even light physical activity can be beneficial. Watch cholesterol and blood lipids Have your blood tested for lipids and cholesterol at 77 years of age, then have this test every 5 years. You may need to have your cholesterol levels checked more often if: Your lipid or cholesterol levels are high. You are older than 77 years of age. You are at high risk for heart disease. What should I know about cancer screening? Many types of cancers can be detected early and may often be prevented. Depending on your health history and family history,  you may need to have cancer screening at various ages. This may include screening for: Colorectal cancer. Prostate cancer. Skin cancer. Lung cancer. What should I know about heart disease, diabetes, and high blood pressure? Blood pressure and heart disease High blood pressure causes heart disease and increases the risk of stroke. This is more likely to develop in people who have high blood pressure readings or are overweight. Talk with your health care provider about your target blood pressure readings. Have your blood pressure checked: Every 3-5 years if you are 53-67 years of age. Every year if you are 62 years old or older. If you are between the ages of 58 and 72 and are a current or former smoker, ask your health care provider if you should have a one-time screening for abdominal aortic aneurysm (AAA). Diabetes Have regular diabetes screenings. This checks your fasting blood sugar level. Have the screening done: Once every three years after age 72 if you are at a normal weight and have a low risk for diabetes. More often and at a younger age if you are overweight or have a high risk for diabetes. What should I know about preventing infection? Hepatitis B If you have a higher risk for hepatitis B, you should be screened for this virus. Talk with your health care provider to find out if you are at risk for hepatitis B infection. Hepatitis C Blood testing is recommended for: Everyone born from [birth year] through 1966. Anyone with known risk factors for hepatitis C. Sexually transmitted infections (STIs) You should  be screened each year for STIs, including gonorrhea and chlamydia, if: You are sexually active and are younger than 77 years of age. You are older than 77 years of age and your health care provider tells you that you are at risk for this type of infection. Your sexual activity has changed since you were last screened, and you are at increased risk for chlamydia or gonorrhea. Ask  your health care provider if you are at risk. Ask your health care provider about whether you are at high risk for HIV. Your health care provider may recommend a prescription medicine to help prevent HIV infection. If you choose to take medicine to prevent HIV, you should first get tested for HIV. You should then be tested every 3 months for as long as you are taking the medicine. Follow these instructions at home: Alcohol use Do not drink alcohol if your health care provider tells you not to drink. If you drink alcohol: Limit how much you have to 0-2 drinks a day. Know how much alcohol is in your drink. In the [place] one drink equals one 12 oz bottle of beer (355 mL), one 5 oz glass of wine (148 mL), or one 1 oz glass of hard liquor (44 mL). Lifestyle Do not use any products that contain nicotine or tobacco. These products include cigarettes, chewing tobacco, and vaping devices, such as e-cigarettes. If you need help quitting, ask your health care provider. Do not use street drugs. Do not share needles. Ask your health care provider for help if you need support or information about quitting drugs. General instructions Schedule regular health, dental, and eye exams. Stay current with your vaccines. Tell your health care provider if: You often feel depressed. You have ever been abused or do not feel safe at home. Summary Adopting a healthy lifestyle and getting preventive care are important in promoting health and wellness. Follow your health care provider's instructions about healthy diet, exercising, and getting tested or screened for diseases. Follow your health care provider's instructions on monitoring your cholesterol and blood pressure. This information is not intended to replace advice given to you by your health care provider. Make sure you discuss any questions you have with your health care provider. Document Revised: 03/07/2021 Document Reviewed: 03/07/2021 Elsevier Patient  Education  [place].

## 2022-08-14 ENCOUNTER — Ambulatory Visit (INDEPENDENT_AMBULATORY_CARE_PROVIDER_SITE_OTHER): Payer: Medicare HMO | Admitting: Internal Medicine

## 2022-08-14 VITALS — BP 138/80 | HR 58 | Temp 98.2°F | Ht 71.5 in | Wt 163.0 lb

## 2022-08-14 DIAGNOSIS — Z0000 Encounter for general adult medical examination without abnormal findings: Secondary | ICD-10-CM | POA: Diagnosis not present

## 2022-08-14 DIAGNOSIS — R69 Illness, unspecified: Secondary | ICD-10-CM | POA: Diagnosis not present

## 2022-08-14 DIAGNOSIS — R739 Hyperglycemia, unspecified: Secondary | ICD-10-CM

## 2022-08-14 DIAGNOSIS — G479 Sleep disorder, unspecified: Secondary | ICD-10-CM

## 2022-08-14 DIAGNOSIS — F5104 Psychophysiologic insomnia: Secondary | ICD-10-CM | POA: Diagnosis not present

## 2022-08-14 DIAGNOSIS — I10 Essential (primary) hypertension: Secondary | ICD-10-CM

## 2022-08-14 LAB — CBC WITH DIFFERENTIAL/PLATELET
Basophils Absolute: 0.1 10*3/uL (ref 0.0–0.1)
Basophils Relative: 0.8 % (ref 0.0–3.0)
Eosinophils Absolute: 0.1 10*3/uL (ref 0.0–0.7)
Eosinophils Relative: 1.4 % (ref 0.0–5.0)
HCT: 41.5 % (ref 39.0–52.0)
Hemoglobin: 13.9 g/dL (ref 13.0–17.0)
Lymphocytes Relative: 25.7 % (ref 12.0–46.0)
Lymphs Abs: 1.6 10*3/uL (ref 0.7–4.0)
MCHC: 33.6 g/dL (ref 30.0–36.0)
MCV: 92.3 fl (ref 78.0–100.0)
Monocytes Absolute: 0.6 10*3/uL (ref 0.1–1.0)
Monocytes Relative: 9.9 % (ref 3.0–12.0)
Neutro Abs: 3.9 10*3/uL (ref 1.4–7.7)
Neutrophils Relative %: 62.2 % (ref 43.0–77.0)
Platelets: 262 10*3/uL (ref 150.0–400.0)
RBC: 4.49 Mil/uL (ref 4.22–5.81)
RDW: 12.9 % (ref 11.5–15.5)
WBC: 6.2 10*3/uL (ref 4.0–10.5)

## 2022-08-14 LAB — COMPREHENSIVE METABOLIC PANEL
ALT: 11 U/L (ref 0–53)
AST: 18 U/L (ref 0–37)
Albumin: 4.5 g/dL (ref 3.5–5.2)
Alkaline Phosphatase: 82 U/L (ref 39–117)
BUN: 9 mg/dL (ref 6–23)
CO2: 31 mEq/L (ref 19–32)
Calcium: 9.7 mg/dL (ref 8.4–10.5)
Chloride: 103 mEq/L (ref 96–112)
Creatinine, Ser: 0.88 mg/dL (ref 0.40–1.50)
GFR: 82.87 mL/min (ref >60.00)
Glucose, Bld: 81 mg/dL (ref 70–99)
Potassium: 4.9 mEq/L (ref 3.5–5.1)
Sodium: 141 mEq/L (ref 135–145)
Total Bilirubin: 1 mg/dL (ref 0.2–1.2)
Total Protein: 7.2 g/dL (ref 6.0–8.3)

## 2022-08-14 LAB — LIPID PANEL
Cholesterol: 167 mg/dL (ref 0–200)
HDL: 52.7 mg/dL (ref >39.00)
LDL Cholesterol: 100 mg/dL — ABNORMAL HIGH (ref 0–99)
NonHDL: 114.68
Total CHOL/HDL Ratio: 3
Triglycerides: 73 mg/dL (ref 0.0–149.0)
VLDL: 14.6 mg/dL (ref 0.0–40.0)

## 2022-08-14 LAB — TSH: TSH: 4.64 u[IU]/mL (ref 0.35–5.50)

## 2022-08-14 LAB — HEMOGLOBIN A1C: Hgb A1c MFr Bld: 5.5 % (ref 4.6–6.5)

## 2022-08-14 NOTE — Assessment & Plan Note (Addendum)
Chronic Controlled, Stable Continue alprazolam 0.25  mg nightly as needed - usually takes in the middle of the night if he can not get back to sleep - takes 2-3 times a week

## 2022-08-14 NOTE — Assessment & Plan Note (Signed)
Chronic Check a1c Low sugar / carb diet Stressed regular exercise  

## 2022-08-14 NOTE — Assessment & Plan Note (Signed)
Chronic Blood pressure well controlled CMP Continue nebivolol 5 mg daily

## 2022-09-10 ENCOUNTER — Other Ambulatory Visit: Payer: Self-pay | Admitting: Internal Medicine

## 2022-10-02 DIAGNOSIS — E755 Other lipid storage disorders: Secondary | ICD-10-CM | POA: Diagnosis not present

## 2022-10-18 DIAGNOSIS — L218 Other seborrheic dermatitis: Secondary | ICD-10-CM | POA: Diagnosis not present

## 2022-10-18 DIAGNOSIS — L821 Other seborrheic keratosis: Secondary | ICD-10-CM | POA: Diagnosis not present

## 2022-10-18 DIAGNOSIS — L570 Actinic keratosis: Secondary | ICD-10-CM | POA: Diagnosis not present

## 2022-10-18 DIAGNOSIS — L812 Freckles: Secondary | ICD-10-CM | POA: Diagnosis not present

## 2022-10-18 DIAGNOSIS — D1801 Hemangioma of skin and subcutaneous tissue: Secondary | ICD-10-CM | POA: Diagnosis not present

## 2022-10-18 DIAGNOSIS — Z85828 Personal history of other malignant neoplasm of skin: Secondary | ICD-10-CM | POA: Diagnosis not present

## 2022-10-29 ENCOUNTER — Encounter: Payer: Self-pay | Admitting: Internal Medicine
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
## 2022-10-29 NOTE — Patient Instructions (Addendum)
       Medications changes include :   none    A referral was ordered for PT.     Someone will call you to schedule an appointment.    Return if symptoms worsen or fail to improve.

## 2022-10-31 ENCOUNTER — Ambulatory Visit (INDEPENDENT_AMBULATORY_CARE_PROVIDER_SITE_OTHER): Payer: Medicare HMO | Admitting: Internal Medicine

## 2022-10-31 VITALS — BP 136/72 | HR 55 | Temp 98.1°F | Ht 71.5 in | Wt 165.0 lb

## 2022-10-31 DIAGNOSIS — G8929 Other chronic pain: Secondary | ICD-10-CM | POA: Diagnosis not present

## 2022-10-31 DIAGNOSIS — I10 Essential (primary) hypertension: Secondary | ICD-10-CM

## 2022-10-31 DIAGNOSIS — G479 Sleep disorder, unspecified: Secondary | ICD-10-CM

## 2022-10-31 DIAGNOSIS — M25511 Pain in right shoulder: Secondary | ICD-10-CM | POA: Diagnosis not present

## 2022-10-31 NOTE — Assessment & Plan Note (Signed)
Chronic Had [place] in 2019 - showed OA, high grad partial tear of prox bicipital tendon Has pain with extreme ROM and slight dec ROM Interested in PT - ? Dry needling help or not Will refer to PT Can consider sports med eval in near future if needed

## 2022-10-31 NOTE — Assessment & Plan Note (Signed)
Chronic Controlled, stable Continue xanax 0.25 mg HS prn

## 2022-10-31 NOTE — Assessment & Plan Note (Signed)
Chronic BP well controlled - in 120-130's at home Continue bystolic 5 mg daily

## 2022-11-01 DIAGNOSIS — M25511 Pain in right shoulder: Secondary | ICD-10-CM | POA: Diagnosis not present

## 2022-11-06 DIAGNOSIS — M25511 Pain in right shoulder: Secondary | ICD-10-CM | POA: Diagnosis not present

## 2022-11-10 DIAGNOSIS — M25511 Pain in right shoulder: Secondary | ICD-10-CM | POA: Diagnosis not present

## 2022-11-14 DIAGNOSIS — M25511 Pain in right shoulder: Secondary | ICD-10-CM | POA: Diagnosis not present

## 2022-11-16 DIAGNOSIS — M25511 Pain in right shoulder: Secondary | ICD-10-CM | POA: Diagnosis not present

## 2022-11-20 DIAGNOSIS — M25511 Pain in right shoulder: Secondary | ICD-10-CM | POA: Diagnosis not present

## 2022-11-23 DIAGNOSIS — M25511 Pain in right shoulder: Secondary | ICD-10-CM | POA: Diagnosis not present

## 2022-11-27 DIAGNOSIS — M25511 Pain in right shoulder: Secondary | ICD-10-CM | POA: Diagnosis not present

## 2022-12-05 DIAGNOSIS — M25511 Pain in right shoulder: Secondary | ICD-10-CM | POA: Diagnosis not present

## 2022-12-12 DIAGNOSIS — M25511 Pain in right shoulder: Secondary | ICD-10-CM | POA: Diagnosis not present

## 2022-12-20 DIAGNOSIS — M25511 Pain in right shoulder: Secondary | ICD-10-CM | POA: Diagnosis not present

## 2022-12-22 ENCOUNTER — Encounter: Payer: Self-pay | Admitting: *Deleted

## 2022-12-22 ENCOUNTER — Telehealth: Payer: Self-pay | Admitting: *Deleted

## 2022-12-22 NOTE — Patient Outreach (Signed)
Care Coordination   Initial Visit Note   12/22/2022 Name: Zachary Turner MRN: [id number] DOB: [birth year]  Zachary Turner is a 77 y.o. year old male who sees Burns, Claudina Lick, MD for primary care. I spoke with  Delfino Lovett T Bowne by phone today.  What matters to the patients health and wellness today?  "I am doing great.  I work out several times a week and it's a good work out-- I stay really active-- play tennis and go to the gym.  I don't take many medications at all.  I am seeing the PT for the dry needling in my shoulder like Dr. Quay Burow referred me-- and I really think it is helping.  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  I eat healthy and am doing great.  I miss my wife who passed away a year and a half ago... but I am not depressed; my family keeps my spirits up and I see them all the time"   Interventions/ care coordination activities completed as below; no ongoing care coordination needs identified; patient was provided with my direct phone number should questions/ concerns/ needs arise in the future   Goals Addressed             This Visit's Progress    Care Coordination Activities   On track    Patient will continue to remain active weekly  Patient will continue to participate in currently established PT sessions for shoulder therapy/ dry needling Patient will consider making appointment with sports medicine provider as recommended by PCP at time of last office visit on 10/31/22 Patient will continue to eat healthy       SDOH assessments and interventions completed:  Yes  SDOH Interventions Today    Flowsheet Row Most Recent Value  SDOH Interventions   Food Insecurity Interventions Intervention Not Indicated  Transportation Interventions Intervention Not Indicated  [drives self]  Physical Activity Interventions Intervention Not Indicated       Care Coordination Interventions:  Yes, provided   Interventions Today    Flowsheet Row Most Recent Value  Chronic Disease   Chronic disease during today's visit Hypertension (HTN)  General Interventions   General Interventions Discussed/Reviewed General Interventions Discussed, Doctor Visits  [provided my direct contact information should questions, concerns, or care coordination needs arise in the future]  Doctor Visits Discussed/Reviewed Doctor Visits Discussed, Doctor Visits Reviewed, PCP  [discussed process to initiate sports medicine evaluation if he decides he wishes to do as per PCP office visit note on 10/31/22 and provided phone number to sports medicine team located at PCP building/ [place]]  PCP/Specialist Visits Compliance with follow-up visit  Exercise Interventions   Exercise Discussed/Reviewed Exercise Discussed, Physical Activity  Physical Activity Discussed/Reviewed Physical Activity Discussed, Types of exercise  Education Interventions   Education Provided Provided Education  Provided Verbal Education On When to see the doctor, Other, Exercise  [Sports medicine provider as recommended during PCP office visit 10/31/22]  [place] Discussed  [depression screening completed- no concerns identified]  Nutrition Interventions   Nutrition Discussed/Reviewed Nutrition Discussed, Decreasing salt  Pharmacy Interventions   Pharmacy Dicussed/Reviewed Pharmacy Topics Discussed  [full medication review completed,  no concerns or discrepancies identified,  self-manages medications verbalizes excellent understanding of purpose/ dosing/ scheduling of medications]      Follow up plan: No further  intervention required.   Encounter Outcome:  Pt. Visit Completed   Oneta Rack, RN, BSN, CCRN Alumnus RN CM Care Coordination/ Transition of [place] Management [phone]: direct office

## 2022-12-27 DIAGNOSIS — M25511 Pain in right shoulder: Secondary | ICD-10-CM | POA: Diagnosis not present

## 2023-01-02 DIAGNOSIS — M25511 Pain in right shoulder: Secondary | ICD-10-CM | POA: Diagnosis not present

## 2023-01-09 DIAGNOSIS — M25511 Pain in right shoulder: Secondary | ICD-10-CM | POA: Diagnosis not present

## 2023-01-16 DIAGNOSIS — M25511 Pain in right shoulder: Secondary | ICD-10-CM | POA: Diagnosis not present

## 2023-01-23 DIAGNOSIS — M25511 Pain in right shoulder: Secondary | ICD-10-CM | POA: Diagnosis not present

## 2023-01-28 ENCOUNTER — Other Ambulatory Visit: Payer: Self-pay | Admitting: Internal Medicine

## 2023-01-30 DIAGNOSIS — M25511 Pain in right shoulder: Secondary | ICD-10-CM | POA: Diagnosis not present

## 2023-02-06 DIAGNOSIS — M25511 Pain in right shoulder: Secondary | ICD-10-CM | POA: Diagnosis not present

## 2023-02-07 ENCOUNTER — Telehealth: Payer: Self-pay | Admitting: *Deleted

## 2023-02-07 ENCOUNTER — Encounter: Payer: Self-pay | Admitting: *Deleted

## 2023-02-07 NOTE — Patient Outreach (Signed)
  Care Coordination   Initial Visit Note   02/07/2023 Name: Zachary Turner MRN: [id number] DOB: 1945/12/26  Zachary Turner is a 77 y.o. year old male who sees Burns, Bobette Mo, MD for primary care. I spoke with  Zachary Turner by phone today. Patient contacted me on 02/06/23 and requested non-urgent call back; returned his call today accordingly  What matters to the patients health and wellness today?  "I wanted to ask if Dr. Lawerance Bach would consider sending a referral to [facility] for them to work on my (R) knee-- she placed a referral to them for my shoulder-- and the PT has been helping.  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  The doctors at [facility] told me they'd be glad to work with my knee, if they got a referral from Dr. Lawerance Bach"   Goals Addressed             This Visit's Progress    Care Coordination Activities: [facility] RN CM   On track    Interventions Today    Flowsheet Row Most Recent Value  Chronic Disease   Chronic disease during today's visit Other  [general health maintenance]  General Interventions   General Interventions Discussed/Reviewed General Interventions Discussed, Doctor Visits, Communication with  Doctor Visits Discussed/Reviewed Doctor Visits Discussed, PCP  PCP/Specialist Visits Compliance with follow-up visit  Communication with PCP/Specialists  [to request consideration of PT referral, as per patient request]  Exercise Interventions   Exercise Discussed/Reviewed Exercise Discussed, Physical Activity  Physical Activity Discussed/Reviewed Physical Activity Discussed, Types of exercise  Education Interventions   Education Provided Provided Education  Provided Verbal Education On Other  [process to get started with PT for his (R) knee-- will make request of PCP to  place referral to [facility] for (R) knee-- as per patient request today,  reminded patient to remember his annual physical exam is due in mid-October]  Nutrition Interventions   Nutrition Discussed/Reviewed Nutrition Discussed             SDOH assessments and interventions completed:  Yes  SDOH Interventions Today    Flowsheet Row Most Recent Value  SDOH Interventions   Food Insecurity Interventions Intervention Not Indicated  Transportation Interventions Intervention Not Indicated  [continues to drive self]  Physical Activity Interventions Intervention Not Indicated  [reports he works out strenuously most days of the week-- plays tennis, uses peloton at home, etc-- very active lifestyle at baseline]       Care Coordination Interventions:  Yes, provided   Follow up plan:  will reach out to patient next week to follow up regarding his request today; he declines ongoing need for care coordination and is agreeable to call-back next week; confirmed patient has my direct phone number should questions/ concerns arise prior to my call    Encounter Outcome:  Pt. Visit Completed   Caryl Pina, RN, BSN, CCRN Alumnus RN CM Care Coordination/ Transition of Care- [facility] Care Management [phone]: direct office

## 2023-02-10 ENCOUNTER — Other Ambulatory Visit: Payer: Self-pay | Admitting: Internal Medicine

## 2023-02-10 DIAGNOSIS — M25561 Pain in right knee: Secondary | ICD-10-CM

## 2023-02-13 ENCOUNTER — Telehealth: Payer: Self-pay | Admitting: *Deleted

## 2023-02-13 ENCOUNTER — Encounter: Payer: Self-pay | Admitting: *Deleted

## 2023-02-13 DIAGNOSIS — M25561 Pain in right knee: Secondary | ICD-10-CM | POA: Diagnosis not present

## 2023-02-13 DIAGNOSIS — M25511 Pain in right shoulder: Secondary | ICD-10-CM | POA: Diagnosis not present

## 2023-02-13 NOTE — Patient Outreach (Signed)
  Care Coordination   Follow Up Visit Note   02/13/2023 Name: Zachary Turner MRN: [id number] DOB: Apr 18, 1946  Zachary Turner is a 77 y.o. year old male who sees Burns, Bobette Mo, MD for primary care. I spoke with  Zachary Turner by phone today.  What matters to the patients health and wellness today?  "Thanks to you and Dr. Lawerance Bach both-- the referral for the PT went through without any problem and they have already started the PT on my knee-- in fact it just started today and I can already tell a difference.  Thanks to you both for getting this going so quickly for me"   Goals Addressed             This Visit's Progress    Care Coordination Activities: [facility] RN CM: No follow up required   On track     Interventions Today    Flowsheet Row Most Recent Value  Chronic Disease   Chronic disease during today's visit Other  [general health maintence- follow up on PT referral request]  General Interventions   General Interventions Discussed/Reviewed General Interventions Discussed  Exercise Interventions   Exercise Discussed/Reviewed Exercise Discussed, Physical Activity  Physical Activity Discussed/Reviewed Gym, Physical Activity Reviewed  [confirmed patient heard from outpatient PT and referral was processed promptly- reports he had his first PT session today]      Interventions Today 02/07/23   Flowsheet Row Most Recent Value  Chronic Disease   Chronic disease during today's visit Other  [general health maintenance]  General Interventions   General Interventions Discussed/Reviewed General Interventions Discussed, Doctor Visits, Communication with  Doctor Visits Discussed/Reviewed Doctor Visits Discussed, PCP  PCP/Specialist Visits Compliance with follow-up visit  Communication with PCP/Specialists  [to request consideration of PT referral, as per patient request]  Exercise Interventions   Exercise Discussed/Reviewed Exercise Discussed, Physical Activity  Physical  Activity Discussed/Reviewed Physical Activity Discussed, Types of exercise  Education Interventions   Education Provided Provided Education  Provided Verbal Education On Other  [process to get started with PT for his (R) knee-- will make request of PCP to place referral to [facility] for (R) knee-- as per patient request today,  reminded patient to remember his annual physical exam is due in mid-October]  Nutrition Interventions   Nutrition Discussed/Reviewed Nutrition Discussed            SDOH assessments and interventions completed:  No Previously assessed on 02/07/23- verified no changes since then   Care Coordination Interventions:  Yes, provided   Follow up plan: No further intervention required.   Encounter Outcome:  Pt. Visit Completed   Caryl Pina, RN, BSN, CCRN Alumnus RN CM Care Coordination/ Transition of Care- [facility] Care Management [phone]: direct office

## 2023-02-14 DIAGNOSIS — M25511 Pain in right shoulder: Secondary | ICD-10-CM | POA: Diagnosis not present

## 2023-02-14 DIAGNOSIS — M25561 Pain in right knee: Secondary | ICD-10-CM | POA: Diagnosis not present

## 2023-02-20 DIAGNOSIS — M25561 Pain in right knee: Secondary | ICD-10-CM | POA: Diagnosis not present

## 2023-02-20 DIAGNOSIS — M25511 Pain in right shoulder: Secondary | ICD-10-CM | POA: Diagnosis not present

## 2023-02-27 DIAGNOSIS — M25561 Pain in right knee: Secondary | ICD-10-CM | POA: Diagnosis not present

## 2023-02-27 DIAGNOSIS — M25511 Pain in right shoulder: Secondary | ICD-10-CM | POA: Diagnosis not present

## 2023-02-28 ENCOUNTER — Telehealth: Payer: Self-pay | Admitting: Internal Medicine

## 2023-02-28 NOTE — Telephone Encounter (Signed)
Contacted Zachary Turner to schedule their annual wellness visit. Appointment made for 03/14/2023.  [facility] Care Guide [facility] AWV TEAM Direct Dial: [phone]

## 2023-03-06 DIAGNOSIS — M25511 Pain in right shoulder: Secondary | ICD-10-CM | POA: Diagnosis not present

## 2023-03-06 DIAGNOSIS — M25561 Pain in right knee: Secondary | ICD-10-CM | POA: Diagnosis not present

## 2023-03-12 ENCOUNTER — Other Ambulatory Visit: Payer: Self-pay | Admitting: Internal Medicine

## 2023-03-13 DIAGNOSIS — M25511 Pain in right shoulder: Secondary | ICD-10-CM | POA: Diagnosis not present

## 2023-03-13 DIAGNOSIS — M25561 Pain in right knee: Secondary | ICD-10-CM | POA: Diagnosis not present

## 2023-03-20 DIAGNOSIS — M25511 Pain in right shoulder: Secondary | ICD-10-CM | POA: Diagnosis not present

## 2023-03-20 DIAGNOSIS — M25561 Pain in right knee: Secondary | ICD-10-CM | POA: Diagnosis not present

## 2023-03-21 DIAGNOSIS — Z85828 Personal history of other malignant neoplasm of skin: Secondary | ICD-10-CM | POA: Diagnosis not present

## 2023-03-21 DIAGNOSIS — D1801 Hemangioma of skin and subcutaneous tissue: Secondary | ICD-10-CM | POA: Diagnosis not present

## 2023-03-21 DIAGNOSIS — L812 Freckles: Secondary | ICD-10-CM | POA: Diagnosis not present

## 2023-03-21 DIAGNOSIS — L821 Other seborrheic keratosis: Secondary | ICD-10-CM | POA: Diagnosis not present

## 2023-03-22 ENCOUNTER — Ambulatory Visit (INDEPENDENT_AMBULATORY_CARE_PROVIDER_SITE_OTHER): Payer: Medicare HMO

## 2023-03-22 VITALS — Ht 71.5 in | Wt 160.0 lb

## 2023-03-22 DIAGNOSIS — Z0000 Encounter for general adult medical examination without abnormal findings: Secondary | ICD-10-CM | POA: Diagnosis not present

## 2023-03-22 NOTE — Patient Instructions (Signed)
Zachary Turner , Thank you for taking time to come for your Medicare Wellness Visit. I appreciate your ongoing commitment to your health goals. Please review the following plan we discussed and let me know if I can assist you in the future.   These are the goals we discussed:  Goals      My goal for 2024 is to stay healthy and keep exercising.        This is a list of the screening recommended for you and due dates:  Health Maintenance  Topic Date Due   DTaP/Tdap/Td vaccine (1 - Tdap) Never done   Zoster (Shingles) Vaccine (1 of 2) Never done   COVID-19 Vaccine (7 - 2023-24 season) 09/20/2022   Hepatitis C Screening: USPSTF Recommendation to screen - Ages 18-79 yo.  04/11/2023*   Flu Shot  05/31/2023   Medicare Annual Wellness Visit  03/21/2024   Pneumonia Vaccine  Completed   HPV Vaccine  Aged Out  *Topic was postponed. The date shown is not the original due date.    Advanced directives: Yes; Please bring a copy of your health care power of attorney and living will to the office at your convenience.  Conditions/risks identified: Yes  Next appointment: Follow up in one year for your annual wellness visit.   Preventive Care 32 Years and Older, Male  Preventive care refers to lifestyle choices and visits with your health care provider that can promote health and wellness. What does preventive care include? A yearly physical exam. This is also called an annual well check. Dental exams once or twice a year. Routine eye exams. Ask your health care provider how often you should have your eyes checked. Personal lifestyle choices, including: Daily care of your teeth and gums. Regular physical activity. Eating a healthy diet. Avoiding tobacco and drug use. Limiting alcohol use. Practicing safe sex. Taking low doses of aspirin every day. Taking vitamin and mineral supplements as recommended by your health care provider. What happens during an annual well check? The services and  screenings done by your health care provider during your annual well check will depend on your age, overall health, lifestyle risk factors, and family history of disease. Counseling  Your health care provider may ask you questions about your: Alcohol use. Tobacco use. Drug use. Emotional well-being. Home and relationship well-being. Sexual activity. Eating habits. History of falls. Memory and ability to understand (cognition). Work and work Astronomer. Screening  You may have the following tests or measurements: Height, weight, and BMI. Blood pressure. Lipid and cholesterol levels. These may be checked every 5 years, or more frequently if you are over 85 years old. Skin check. Lung cancer screening. You may have this screening every year starting at age 29 if you have a 30-pack-year history of smoking and currently smoke or have quit within the past 15 years. Fecal occult blood test (FOBT) of the stool. You may have this test every year starting at age 38. Flexible sigmoidoscopy or colonoscopy. You may have a sigmoidoscopy every 5 years or a colonoscopy every 10 years starting at age 57. Prostate cancer screening. Recommendations will vary depending on your family history and other risks. Hepatitis C blood test. Hepatitis B blood test. Sexually transmitted disease (STD) testing. Diabetes screening. This is done by checking your blood sugar (glucose) after you have not eaten for a while (fasting). You may have this done every 1-3 years. Abdominal aortic aneurysm (AAA) screening. You may need this if you are a current  or former smoker. Osteoporosis. You may be screened starting at age 1 if you are at high risk. Talk with your health care provider about your test results, treatment options, and if necessary, the need for more tests. Vaccines  Your health care provider may recommend certain vaccines, such as: Influenza vaccine. This is recommended every year. Tetanus, diphtheria, and  acellular pertussis (Tdap, Td) vaccine. You may need a Td booster every 10 years. Zoster vaccine. You may need this after age 32. Pneumococcal 13-valent conjugate (PCV13) vaccine. One dose is recommended after age 61. Pneumococcal polysaccharide (PPSV23) vaccine. One dose is recommended after age 68. Talk to your health care provider about which screenings and vaccines you need and how often you need them. This information is not intended to replace advice given to you by your health care provider. Make sure you discuss any questions you have with your health care provider. Document Released: 11/12/2015 Document Revised: 07/05/2016 Document Reviewed: 08/17/2015 Elsevier Interactive Patient Education  2017 [employer].  Fall Prevention in the Home Falls can cause injuries. They can happen to people of all ages. There are many things you can do to make your home safe and to help prevent falls. What can I do on the outside of my home? Regularly fix the edges of walkways and driveways and fix any cracks. Remove anything that might make you trip as you walk through a door, such as a raised step or threshold. Trim any bushes or trees on the path to your home. Use bright outdoor lighting. Clear any walking paths of anything that might make someone trip, such as rocks or tools. Regularly check to see if handrails are loose or broken. Make sure that both sides of any steps have handrails. Any raised decks and porches should have guardrails on the edges. Have any leaves, snow, or ice cleared regularly. Use sand or salt on walking paths during winter. Clean up any spills in your garage right away. This includes oil or grease spills. What can I do in the bathroom? Use night lights. Install grab bars by the toilet and in the tub and shower. Do not use towel bars as grab bars. Use non-skid mats or decals in the tub or shower. If you need to sit down in the shower, use a plastic, non-slip stool. Keep  the floor dry. Clean up any water that spills on the floor as soon as it happens. Remove soap buildup in the tub or shower regularly. Attach bath mats securely with double-sided non-slip rug tape. Do not have throw rugs and other things on the floor that can make you trip. What can I do in the bedroom? Use night lights. Make sure that you have a light by your bed that is easy to reach. Do not use any sheets or blankets that are too big for your bed. They should not hang down onto the floor. Have a firm chair that has side arms. You can use this for support while you get dressed. Do not have throw rugs and other things on the floor that can make you trip. What can I do in the kitchen? Clean up any spills right away. Avoid walking on wet floors. Keep items that you use a lot in easy-to-reach places. If you need to reach something above you, use a strong step stool that has a grab bar. Keep electrical cords out of the way. Do not use floor polish or wax that makes floors slippery. If you must use wax,  use non-skid floor wax. Do not have throw rugs and other things on the floor that can make you trip. What can I do with my stairs? Do not leave any items on the stairs. Make sure that there are handrails on both sides of the stairs and use them. Fix handrails that are broken or loose. Make sure that handrails are as long as the stairways. Check any carpeting to make sure that it is firmly attached to the stairs. Fix any carpet that is loose or worn. Avoid having throw rugs at the top or bottom of the stairs. If you do have throw rugs, attach them to the floor with carpet tape. Make sure that you have a light switch at the top of the stairs and the bottom of the stairs. If you do not have them, ask someone to add them for you. What else can I do to help prevent falls? Wear shoes that: Do not have high heels. Have rubber bottoms. Are comfortable and fit you well. Are closed at the toe. Do not  wear sandals. If you use a stepladder: Make sure that it is fully opened. Do not climb a closed stepladder. Make sure that both sides of the stepladder are locked into place. Ask someone to hold it for you, if possible. Clearly mark and make sure that you can see: Any grab bars or handrails. First and last steps. Where the edge of each step is. Use tools that help you move around (mobility aids) if they are needed. These include: Canes. Walkers. Scooters. Crutches. Turn on the lights when you go into a dark area. Replace any light bulbs as soon as they burn out. Set up your furniture so you have a clear path. Avoid moving your furniture around. If any of your floors are uneven, fix them. If there are any pets around you, be aware of where they are. Review your medicines with your doctor. Some medicines can make you feel dizzy. This can increase your chance of falling. Ask your doctor what other things that you can do to help prevent falls. This information is not intended to replace advice given to you by your health care provider. Make sure you discuss any questions you have with your health care provider. Document Released: 08/12/2009 Document Revised: 03/23/2016 Document Reviewed: 11/20/2014 Elsevier Interactive Patient Education  2017 [employer].

## 2023-03-22 NOTE — Progress Notes (Signed)
I connected with  Zachary Turner on 03/22/23 by a audio enabled telemedicine application and verified that I am speaking with the correct person using two identifiers.  Patient Location: Home  Provider Location: Office/Clinic  I discussed the limitations of evaluation and management by telemedicine. The patient expressed understanding and agreed to proceed.  Subjective:   Zachary Turner is a 77 y.o. male who presents for Medicare Annual/Subsequent preventive examination.  Review of Systems     Cardiac Risk Factors include: advanced age (>65men, >25 women);hypertension;male gender;family history of premature cardiovascular disease     Objective:    Today's Vitals   03/22/23 1302  Weight: 160 lb (72.6 kg)  Height: 5' 11.5" (1.816 m)  PainSc: 0-No pain   Body mass index is 22 kg/m.     03/22/2023    1:04 PM  Advanced Directives  Does Patient Have a Medical Advance Directive? Yes  Type of Estate agent of [place];Living will  Copy of Healthcare Power of Attorney in Chart? No - copy requested    Current Medications (verified) Outpatient Encounter Medications as of 03/22/2023  Medication Sig   ALPRAZolam (XANAX) 0.5 MG tablet TAKE 0.5-1 TABLETS (0.25-0.5 MG TOTAL) BY MOUTH AT BEDTIME AS NEEDED FOR ANXIETY.   fluocinonide cream (LIDEX) 0.05 % Apply 1 application topically 2 (two) times daily.   ibuprofen (ADVIL,MOTRIN) 200 MG tablet Take 400 mg by mouth 2 (two) times daily as needed for pain.   MAGNESIUM CITRATE PO Take 1 tablet by mouth daily.   Multiple Vitamin (MULTIVITAMIN WITH MINERALS) TABS Take 1 tablet by mouth daily. Spectracite   nebivolol (BYSTOLIC) 5 MG tablet TAKE 1 TABLET (5 MG TOTAL) BY MOUTH DAILY.   TURMERIC PO Take 100 mg by mouth daily.   No facility-administered encounter medications on file as of 03/22/2023.    Allergies (verified) Patient has no known allergies.   History: Past Medical History:  Diagnosis Date    Hypertension    Past Surgical History:  Procedure Laterality Date   HERNIA REPAIR     REPLACEMENT TOTAL KNEE     TONSILLECTOMY     Family History  Problem Relation Age of Onset   Hypertension Mother    Hypertension Father    Obesity Sister    Atrial fibrillation Sister    Heart disease Brother    Obesity Sister    Social History   Socioeconomic History   Marital status: Married    Spouse name: Not on file   Number of children: Not on file   Years of education: Not on file   Highest education level: Not on file  Occupational History   Not on file  Tobacco Use   Smoking status: Never   Smokeless tobacco: Never  Substance and Sexual Activity   Alcohol use: Yes    Alcohol/week: 1.0 standard drink of alcohol    Types: 1 Glasses of wine per week    Comment: Socially   Drug use: No   Sexual activity: Not on file  Other Topics Concern   Not on file  Social History Narrative   Not on file   Social Determinants of Health   Financial Resource Strain: Low Risk  (03/22/2023)   Overall Financial Resource Strain (CARDIA)    Difficulty of Paying Living Expenses: Not hard at all  Food Insecurity: No Food Insecurity (03/22/2023)   Hunger Vital Sign    Worried About Running Out of Food in the Last Year: Never true  Ran Out of Food in the Last Year: Never true  Transportation Needs: No Transportation Needs (03/22/2023)   PRAPARE - Administrator, Civil Service (Medical): No    Lack of Transportation (Non-Medical): No  Physical Activity: Sufficiently Active (03/22/2023)   Exercise Vital Sign    Days of Exercise per Week: 5 days    Minutes of Exercise per Session: 70 min  Stress: No Stress Concern Present (03/22/2023)   [employer] of Occupational Health - Occupational Stress Questionnaire    Feeling of Stress : Only a little  Social Connections: Moderately Integrated (03/22/2023)   Social Connection and Isolation Panel [NHANES]    Frequency of Communication  with Friends and Family: More than three times a week    Frequency of Social Gatherings with Friends and Family: More than three times a week    Attends Religious Services: More than 4 times per year    Active Member of [employer] or Organizations: Yes    Attends Banker Meetings: More than 4 times per year    Marital Status: Widowed    Tobacco Counseling Counseling given: Not Answered   Clinical Intake:  Pre-visit preparation completed: Yes  Pain : No/denies pain Pain Score: 0-No pain     BMI - recorded: 22 Nutritional Status: BMI of 19-24  Normal Nutritional Risks: None Diabetes: No  How often do you need to have someone help you when you read instructions, pamphlets, or other written materials from your doctor or pharmacy?: 1 - Never What is the last grade level you completed in school?: Master's Degree in Business  Diabetic? No  Interpreter Needed?: No  Information entered by :: Koal Eslinger N. Jabriel Vanduyne, LPN.   Activities of Daily Living    03/22/2023    1:08 PM 04/10/2022    2:39 PM  In your present state of health, do you have any difficulty performing the following activities:  Hearing? 0 0  Vision? 0 0  Difficulty concentrating or making decisions? 0 0  Walking or climbing stairs? 0 0  Dressing or bathing? 0 0  Doing errands, shopping? 0 0  Preparing Food and eating ? N   Using the Toilet? N   In the past six months, have you accidently leaked urine? N   Do you have problems with loss of bowel control? N   Managing your Medications? N   Managing your Finances? N   Housekeeping or managing your Housekeeping? N     Patient Care Team: Pincus Sanes, MD as PCP - [facility], [facility] as Consulting Physician (Ophthalmology)  Indicate any recent Medical Services you may have received from other than [facility] providers in the past year (date may be approximate).     Assessment:   This is a routine wellness examination for  Zachary Turner.  Hearing/Vision screen Hearing Screening - Comments:: Patient denied any hearing difficulty.   No hearing aids.  Vision Screening - Comments:: Patient does wear readers.  [facility] done by: [facility]   Dietary issues and exercise activities discussed: Current Exercise Habits: Home exercise routine, Type of exercise: walking;treadmill;stretching;strength training/weights, Time (Minutes): 60, Frequency (Times/Week): 4, Weekly Exercise (Minutes/Week): 240, Intensity: Moderate, Exercise limited by: None identified   Goals Addressed             This Visit's Progress    My goal for 2024 is to stay healthy and keep exercising.        Depression Screen  03/22/2023    1:05 PM 12/22/2022    3:38 PM 10/31/2022    7:54 AM 04/10/2022    2:34 PM 07/21/2020    3:44 PM 02/26/2019    9:17 AM  PHQ 2/9 Scores  PHQ - 2 Score 0 0 0 0 0 0  PHQ- 9 Score 0         Fall Risk    03/22/2023    1:05 PM 10/31/2022    7:54 AM 04/10/2022    2:38 PM 08/23/2021    9:36 AM 07/21/2020    3:44 PM  Fall Risk   Falls in the past year? 0 0 0 0 0  Number falls in past yr: 0 0 0 0 0  Injury with Fall? 0 0 0 0 0  Risk for fall due to : No Fall Risks No Fall Risks No Fall Risks No Fall Risks   Follow up Falls prevention discussed Falls evaluation completed Falls evaluation completed Falls evaluation completed     FALL RISK PREVENTION PERTAINING TO THE HOME:  Any stairs in or around the home? Yes  If so, are there any without handrails? No  Home free of loose throw rugs in walkways, pet beds, electrical cords, etc? Yes  Adequate lighting in your home to reduce risk of falls? Yes   ASSISTIVE DEVICES UTILIZED TO PREVENT FALLS:  Life alert? No  Use of a cane, walker or w/c? No  Grab bars in the bathroom? Yes  Shower chair or bench in shower? Yes  Elevated toilet seat or a handicapped toilet? Yes   TIMED UP AND GO:  Was the test performed? No . Telephonic Visit  Cognitive  Function:        03/22/2023    1:05 PM  6CIT Screen  What Year? 0 points  What month? 0 points  What time? 0 points  Count back from 20 0 points  Months in reverse 0 points  Repeat phrase 0 points  Total Score 0 points    Immunizations Immunization History  Administered Date(s) Administered   Fluad Quad(high Dose 65+) 06/30/2019, 07/21/2020, 07/20/2022   Influenza Whole 07/12/2008, 10/15/2008, 08/12/2009, 08/09/2010   Influenza, High Dose Seasonal PF 07/26/2017, 07/22/2018   PFIZER Comirnaty(Gray Top)Covid-19 Tri-Sucrose Vaccine 01/18/2021, 07/26/2022   PFIZER(Purple Top)SARS-COV-2 Vaccination 11/14/2019, 12/05/2019, 06/28/2020   Pfizer Covid-19 Vaccine Bivalent Booster 79yrs & up 07/11/2021   Pneumococcal Conjugate-13 07/24/2014   Pneumococcal Polysaccharide-23 09/11/2011, 08/05/2020   Respiratory Syncytial Virus Vaccine,Recomb Aduvanted(Arexvy) 07/26/2022   Zoster, Live 11/11/2012    TDAP status: Due, Education has been provided regarding the importance of this vaccine. Advised may receive this vaccine at local pharmacy or Health Dept. Aware to provide a copy of the vaccination record if obtained from local pharmacy or Health Dept. Verbalized acceptance and understanding.  Flu Vaccine status: Up to date  Pneumococcal vaccine status: Up to date  Covid-19 vaccine status: Completed vaccines  Qualifies for Shingles Vaccine? Yes   Zostavax completed Yes   Shingrix Completed?: No.    Education has been provided regarding the importance of this vaccine. Patient has been advised to call insurance company to determine out of pocket expense if they have not yet received this vaccine. Advised may also receive vaccine at local pharmacy or Health Dept. Verbalized acceptance and understanding.  Screening Tests Health Maintenance  Topic Date Due   DTaP/Tdap/Td (1 - Tdap) Never done   Zoster Vaccines- Shingrix (1 of 2) Never done   COVID-19 Vaccine (7 - 2023-24 season)  09/20/2022    Hepatitis C Screening  04/11/2023 (Originally 12/24/1963)   INFLUENZA VACCINE  05/31/2023   Medicare Annual Wellness (AWV)  03/21/2024   Pneumonia Vaccine 78+ Years old  Completed   HPV VACCINES  Aged Out    Health Maintenance  Health Maintenance Due  Topic Date Due   DTaP/Tdap/Td (1 - Tdap) Never done   Zoster Vaccines- Shingrix (1 of 2) Never done   COVID-19 Vaccine (7 - 2023-24 season) 09/20/2022    Colorectal cancer screening: No longer required.   Lung Cancer Screening: (Low Dose CT Chest recommended if Age 73-80 years, 30 pack-year currently smoking OR have quit w/in 15years.) does not qualify.   Lung Cancer Screening Referral: No  Additional Screening:  Hepatitis C Screening: does qualify; Completed: Postponed   Vision Screening: Recommended annual ophthalmology exams for early detection of glaucoma and other disorders of the eye. Is the patient up to date with their annual eye exam?  Yes  Who is the provider or what is the name of the office in which the patient attends annual eye exams? [facility] If pt is not established with a provider, would they like to be referred to a provider to establish care? No .   Dental Screening: Recommended annual dental exams for proper oral hygiene  Community Resource Referral / Chronic Care Management: CRR required this visit?  No   CCM required this visit?  No      Plan:     I have personally reviewed and noted the following in the patient's chart:   Medical and social history Use of alcohol, tobacco or illicit drugs  Current medications and supplements including opioid prescriptions. Patient is not currently taking opioid prescriptions. Functional ability and status Nutritional status Physical activity Advanced directives List of other physicians Hospitalizations, surgeries, and ER visits in previous 12 months Vitals Screenings to include cognitive, depression, and falls Referrals and appointments  In  addition, I have reviewed and discussed with patient certain preventive protocols, quality metrics, and best practice recommendations. A written personalized care plan for preventive services as well as general preventive health recommendations were provided to patient.     Mickeal Needy, LPN   [id number]   Nurse Notes:  Normal cognitive status assessed by direct observation via telephone conversation by this Nurse Health Advisor. No abnormalities found.

## 2023-03-27 DIAGNOSIS — M25511 Pain in right shoulder: Secondary | ICD-10-CM | POA: Diagnosis not present

## 2023-03-27 DIAGNOSIS — M25561 Pain in right knee: Secondary | ICD-10-CM | POA: Diagnosis not present

## 2023-04-03 DIAGNOSIS — M25511 Pain in right shoulder: Secondary | ICD-10-CM | POA: Diagnosis not present

## 2023-04-03 DIAGNOSIS — M25561 Pain in right knee: Secondary | ICD-10-CM | POA: Diagnosis not present

## 2023-04-10 DIAGNOSIS — M25561 Pain in right knee: Secondary | ICD-10-CM | POA: Diagnosis not present

## 2023-04-10 DIAGNOSIS — M25511 Pain in right shoulder: Secondary | ICD-10-CM | POA: Diagnosis not present

## 2023-04-17 DIAGNOSIS — M25511 Pain in right shoulder: Secondary | ICD-10-CM | POA: Diagnosis not present

## 2023-04-17 DIAGNOSIS — M25561 Pain in right knee: Secondary | ICD-10-CM | POA: Diagnosis not present

## 2023-05-01 DIAGNOSIS — M25511 Pain in right shoulder: Secondary | ICD-10-CM | POA: Diagnosis not present

## 2023-05-01 DIAGNOSIS — M25561 Pain in right knee: Secondary | ICD-10-CM | POA: Diagnosis not present

## 2023-05-08 DIAGNOSIS — M25561 Pain in right knee: Secondary | ICD-10-CM | POA: Diagnosis not present

## 2023-05-08 DIAGNOSIS — M25511 Pain in right shoulder: Secondary | ICD-10-CM | POA: Diagnosis not present

## 2023-05-15 DIAGNOSIS — M25561 Pain in right knee: Secondary | ICD-10-CM | POA: Diagnosis not present

## 2023-05-15 DIAGNOSIS — M25511 Pain in right shoulder: Secondary | ICD-10-CM | POA: Diagnosis not present

## 2023-05-22 DIAGNOSIS — M25561 Pain in right knee: Secondary | ICD-10-CM | POA: Diagnosis not present

## 2023-05-22 DIAGNOSIS — M25511 Pain in right shoulder: Secondary | ICD-10-CM | POA: Diagnosis not present

## 2023-05-29 DIAGNOSIS — M25561 Pain in right knee: Secondary | ICD-10-CM | POA: Diagnosis not present

## 2023-05-29 DIAGNOSIS — M25511 Pain in right shoulder: Secondary | ICD-10-CM | POA: Diagnosis not present

## 2023-06-05 DIAGNOSIS — M25511 Pain in right shoulder: Secondary | ICD-10-CM | POA: Diagnosis not present

## 2023-06-05 DIAGNOSIS — M25561 Pain in right knee: Secondary | ICD-10-CM | POA: Diagnosis not present

## 2023-06-07 DIAGNOSIS — H35362 Drusen (degenerative) of macula, left eye: Secondary | ICD-10-CM | POA: Diagnosis not present

## 2023-06-12 DIAGNOSIS — M25511 Pain in right shoulder: Secondary | ICD-10-CM | POA: Diagnosis not present

## 2023-06-12 DIAGNOSIS — M25561 Pain in right knee: Secondary | ICD-10-CM | POA: Diagnosis not present

## 2023-06-19 DIAGNOSIS — H18453 Nodular corneal degeneration, bilateral: Secondary | ICD-10-CM | POA: Diagnosis not present

## 2023-06-20 DIAGNOSIS — M25511 Pain in right shoulder: Secondary | ICD-10-CM | POA: Diagnosis not present

## 2023-06-20 DIAGNOSIS — M25561 Pain in right knee: Secondary | ICD-10-CM | POA: Diagnosis not present

## 2023-06-26 DIAGNOSIS — M25511 Pain in right shoulder: Secondary | ICD-10-CM | POA: Diagnosis not present

## 2023-06-26 DIAGNOSIS — M25561 Pain in right knee: Secondary | ICD-10-CM | POA: Diagnosis not present

## 2023-07-03 DIAGNOSIS — M25511 Pain in right shoulder: Secondary | ICD-10-CM | POA: Diagnosis not present

## 2023-07-03 DIAGNOSIS — M25561 Pain in right knee: Secondary | ICD-10-CM | POA: Diagnosis not present

## 2023-07-10 DIAGNOSIS — M25561 Pain in right knee: Secondary | ICD-10-CM | POA: Diagnosis not present

## 2023-07-10 DIAGNOSIS — M25511 Pain in right shoulder: Secondary | ICD-10-CM | POA: Diagnosis not present

## 2023-07-17 DIAGNOSIS — M25561 Pain in right knee: Secondary | ICD-10-CM | POA: Diagnosis not present

## 2023-07-17 DIAGNOSIS — M25511 Pain in right shoulder: Secondary | ICD-10-CM | POA: Diagnosis not present

## 2023-07-24 DIAGNOSIS — M25511 Pain in right shoulder: Secondary | ICD-10-CM | POA: Diagnosis not present

## 2023-07-24 DIAGNOSIS — M25561 Pain in right knee: Secondary | ICD-10-CM | POA: Diagnosis not present

## 2023-07-31 DIAGNOSIS — L820 Inflamed seborrheic keratosis: Secondary | ICD-10-CM | POA: Diagnosis not present

## 2023-07-31 DIAGNOSIS — M25561 Pain in right knee: Secondary | ICD-10-CM | POA: Diagnosis not present

## 2023-07-31 DIAGNOSIS — L738 Other specified follicular disorders: Secondary | ICD-10-CM | POA: Diagnosis not present

## 2023-07-31 DIAGNOSIS — L821 Other seborrheic keratosis: Secondary | ICD-10-CM | POA: Diagnosis not present

## 2023-07-31 DIAGNOSIS — M25511 Pain in right shoulder: Secondary | ICD-10-CM | POA: Diagnosis not present

## 2023-07-31 DIAGNOSIS — Z85828 Personal history of other malignant neoplasm of skin: Secondary | ICD-10-CM | POA: Diagnosis not present

## 2023-08-07 DIAGNOSIS — M25561 Pain in right knee: Secondary | ICD-10-CM | POA: Diagnosis not present

## 2023-08-07 DIAGNOSIS — M25511 Pain in right shoulder: Secondary | ICD-10-CM | POA: Diagnosis not present

## 2023-08-14 DIAGNOSIS — M25511 Pain in right shoulder: Secondary | ICD-10-CM | POA: Diagnosis not present

## 2023-08-14 DIAGNOSIS — M25561 Pain in right knee: Secondary | ICD-10-CM | POA: Diagnosis not present

## 2023-08-21 DIAGNOSIS — M25511 Pain in right shoulder: Secondary | ICD-10-CM | POA: Diagnosis not present

## 2023-08-21 DIAGNOSIS — M25561 Pain in right knee: Secondary | ICD-10-CM | POA: Diagnosis not present

## 2023-08-22 ENCOUNTER — Other Ambulatory Visit: Payer: Self-pay | Admitting: Internal Medicine

## 2023-08-28 DIAGNOSIS — M25561 Pain in right knee: Secondary | ICD-10-CM | POA: Diagnosis not present

## 2023-08-28 DIAGNOSIS — M25511 Pain in right shoulder: Secondary | ICD-10-CM | POA: Diagnosis not present

## 2023-09-04 DIAGNOSIS — M25561 Pain in right knee: Secondary | ICD-10-CM | POA: Diagnosis not present

## 2023-09-04 DIAGNOSIS — M25511 Pain in right shoulder: Secondary | ICD-10-CM | POA: Diagnosis not present

## 2023-09-11 DIAGNOSIS — M25511 Pain in right shoulder: Secondary | ICD-10-CM | POA: Diagnosis not present

## 2023-09-11 DIAGNOSIS — M25561 Pain in right knee: Secondary | ICD-10-CM | POA: Diagnosis not present

## 2023-09-18 DIAGNOSIS — M25511 Pain in right shoulder: Secondary | ICD-10-CM | POA: Diagnosis not present

## 2023-09-18 DIAGNOSIS — M25561 Pain in right knee: Secondary | ICD-10-CM | POA: Diagnosis not present

## 2023-09-25 DIAGNOSIS — M25561 Pain in right knee: Secondary | ICD-10-CM | POA: Diagnosis not present

## 2023-09-25 DIAGNOSIS — M25511 Pain in right shoulder: Secondary | ICD-10-CM | POA: Diagnosis not present

## 2023-10-02 DIAGNOSIS — M25561 Pain in right knee: Secondary | ICD-10-CM | POA: Diagnosis not present

## 2023-10-02 DIAGNOSIS — M25511 Pain in right shoulder: Secondary | ICD-10-CM | POA: Diagnosis not present

## 2023-10-09 DIAGNOSIS — M25511 Pain in right shoulder: Secondary | ICD-10-CM | POA: Diagnosis not present

## 2023-10-09 DIAGNOSIS — M25561 Pain in right knee: Secondary | ICD-10-CM | POA: Diagnosis not present

## 2023-10-16 DIAGNOSIS — M25561 Pain in right knee: Secondary | ICD-10-CM | POA: Diagnosis not present

## 2023-10-16 DIAGNOSIS — M25511 Pain in right shoulder: Secondary | ICD-10-CM | POA: Diagnosis not present

## 2023-10-30 DIAGNOSIS — M25561 Pain in right knee: Secondary | ICD-10-CM | POA: Diagnosis not present

## 2023-10-30 DIAGNOSIS — M25511 Pain in right shoulder: Secondary | ICD-10-CM | POA: Diagnosis not present

## 2023-11-06 DIAGNOSIS — M25561 Pain in right knee: Secondary | ICD-10-CM | POA: Diagnosis not present

## 2023-11-06 DIAGNOSIS — M25511 Pain in right shoulder: Secondary | ICD-10-CM | POA: Diagnosis not present

## 2023-11-13 DIAGNOSIS — M25561 Pain in right knee: Secondary | ICD-10-CM | POA: Diagnosis not present

## 2023-11-13 DIAGNOSIS — M25511 Pain in right shoulder: Secondary | ICD-10-CM | POA: Diagnosis not present

## 2023-11-20 DIAGNOSIS — M25561 Pain in right knee: Secondary | ICD-10-CM | POA: Diagnosis not present

## 2023-11-20 DIAGNOSIS — M25511 Pain in right shoulder: Secondary | ICD-10-CM | POA: Diagnosis not present

## 2023-11-20 DIAGNOSIS — L723 Sebaceous cyst: Secondary | ICD-10-CM | POA: Diagnosis not present

## 2023-11-20 DIAGNOSIS — Z85828 Personal history of other malignant neoplasm of skin: Secondary | ICD-10-CM | POA: Diagnosis not present

## 2023-11-27 DIAGNOSIS — M25561 Pain in right knee: Secondary | ICD-10-CM | POA: Diagnosis not present

## 2023-11-27 DIAGNOSIS — M25511 Pain in right shoulder: Secondary | ICD-10-CM | POA: Diagnosis not present

## 2023-12-04 DIAGNOSIS — M25561 Pain in right knee: Secondary | ICD-10-CM | POA: Diagnosis not present

## 2023-12-04 DIAGNOSIS — M25511 Pain in right shoulder: Secondary | ICD-10-CM | POA: Diagnosis not present

## 2023-12-11 DIAGNOSIS — M25561 Pain in right knee: Secondary | ICD-10-CM | POA: Diagnosis not present

## 2023-12-11 DIAGNOSIS — M25511 Pain in right shoulder: Secondary | ICD-10-CM | POA: Diagnosis not present

## 2023-12-18 DIAGNOSIS — M25511 Pain in right shoulder: Secondary | ICD-10-CM | POA: Diagnosis not present

## 2023-12-18 DIAGNOSIS — M25561 Pain in right knee: Secondary | ICD-10-CM | POA: Diagnosis not present

## 2023-12-24 DIAGNOSIS — M25561 Pain in right knee: Secondary | ICD-10-CM | POA: Diagnosis not present

## 2023-12-24 DIAGNOSIS — M25511 Pain in right shoulder: Secondary | ICD-10-CM | POA: Diagnosis not present

## 2024-01-01 DIAGNOSIS — M25511 Pain in right shoulder: Secondary | ICD-10-CM | POA: Diagnosis not present

## 2024-01-01 DIAGNOSIS — M25561 Pain in right knee: Secondary | ICD-10-CM | POA: Diagnosis not present

## 2024-01-08 DIAGNOSIS — M25511 Pain in right shoulder: Secondary | ICD-10-CM | POA: Diagnosis not present

## 2024-01-08 DIAGNOSIS — M25561 Pain in right knee: Secondary | ICD-10-CM | POA: Diagnosis not present

## 2024-01-15 DIAGNOSIS — M25511 Pain in right shoulder: Secondary | ICD-10-CM | POA: Diagnosis not present

## 2024-01-15 DIAGNOSIS — M25561 Pain in right knee: Secondary | ICD-10-CM | POA: Diagnosis not present

## 2024-01-22 DIAGNOSIS — M25561 Pain in right knee: Secondary | ICD-10-CM | POA: Diagnosis not present

## 2024-01-22 DIAGNOSIS — M25511 Pain in right shoulder: Secondary | ICD-10-CM | POA: Diagnosis not present

## 2024-01-29 DIAGNOSIS — M25511 Pain in right shoulder: Secondary | ICD-10-CM | POA: Diagnosis not present

## 2024-01-29 DIAGNOSIS — M25561 Pain in right knee: Secondary | ICD-10-CM | POA: Diagnosis not present

## 2024-01-30 DIAGNOSIS — D485 Neoplasm of uncertain behavior of skin: Secondary | ICD-10-CM | POA: Diagnosis not present

## 2024-01-30 DIAGNOSIS — L821 Other seborrheic keratosis: Secondary | ICD-10-CM | POA: Diagnosis not present

## 2024-01-30 DIAGNOSIS — Z85828 Personal history of other malignant neoplasm of skin: Secondary | ICD-10-CM | POA: Diagnosis not present

## 2024-01-30 DIAGNOSIS — L570 Actinic keratosis: Secondary | ICD-10-CM | POA: Diagnosis not present

## 2024-01-30 DIAGNOSIS — L738 Other specified follicular disorders: Secondary | ICD-10-CM | POA: Diagnosis not present

## 2024-01-30 DIAGNOSIS — D1801 Hemangioma of skin and subcutaneous tissue: Secondary | ICD-10-CM | POA: Diagnosis not present

## 2024-01-30 DIAGNOSIS — L812 Freckles: Secondary | ICD-10-CM | POA: Diagnosis not present

## 2024-01-30 DIAGNOSIS — C44722 Squamous cell carcinoma of skin of right lower limb, including hip: Secondary | ICD-10-CM | POA: Diagnosis not present

## 2024-02-05 DIAGNOSIS — M25561 Pain in right knee: Secondary | ICD-10-CM | POA: Diagnosis not present

## 2024-02-05 DIAGNOSIS — M25511 Pain in right shoulder: Secondary | ICD-10-CM | POA: Diagnosis not present

## 2024-02-12 ENCOUNTER — Other Ambulatory Visit: Payer: Self-pay | Admitting: Internal Medicine

## 2024-02-12 DIAGNOSIS — M25511 Pain in right shoulder: Secondary | ICD-10-CM | POA: Diagnosis not present

## 2024-02-12 DIAGNOSIS — M25561 Pain in right knee: Secondary | ICD-10-CM | POA: Diagnosis not present

## 2024-02-19 DIAGNOSIS — M25561 Pain in right knee: Secondary | ICD-10-CM | POA: Diagnosis not present

## 2024-02-19 DIAGNOSIS — M25511 Pain in right shoulder: Secondary | ICD-10-CM | POA: Diagnosis not present

## 2024-02-26 DIAGNOSIS — M25511 Pain in right shoulder: Secondary | ICD-10-CM | POA: Diagnosis not present

## 2024-02-26 DIAGNOSIS — M25561 Pain in right knee: Secondary | ICD-10-CM | POA: Diagnosis not present

## 2024-02-28 ENCOUNTER — Other Ambulatory Visit: Payer: Self-pay | Admitting: Internal Medicine

## 2024-03-04 DIAGNOSIS — M25561 Pain in right knee: Secondary | ICD-10-CM | POA: Diagnosis not present

## 2024-03-04 DIAGNOSIS — M25511 Pain in right shoulder: Secondary | ICD-10-CM | POA: Diagnosis not present

## 2024-03-11 DIAGNOSIS — M25561 Pain in right knee: Secondary | ICD-10-CM | POA: Diagnosis not present

## 2024-03-11 DIAGNOSIS — M25511 Pain in right shoulder: Secondary | ICD-10-CM | POA: Diagnosis not present

## 2024-03-18 DIAGNOSIS — M25511 Pain in right shoulder: Secondary | ICD-10-CM | POA: Diagnosis not present

## 2024-03-18 DIAGNOSIS — M25561 Pain in right knee: Secondary | ICD-10-CM | POA: Diagnosis not present

## 2024-03-25 DIAGNOSIS — M25511 Pain in right shoulder: Secondary | ICD-10-CM | POA: Diagnosis not present

## 2024-03-25 DIAGNOSIS — M25561 Pain in right knee: Secondary | ICD-10-CM | POA: Diagnosis not present

## 2024-03-26 ENCOUNTER — Ambulatory Visit (INDEPENDENT_AMBULATORY_CARE_PROVIDER_SITE_OTHER): Payer: Medicare HMO

## 2024-03-26 VITALS — Ht 72.0 in | Wt 160.0 lb

## 2024-03-26 DIAGNOSIS — Z1159 Encounter for screening for other viral diseases: Secondary | ICD-10-CM | POA: Diagnosis not present

## 2024-03-26 DIAGNOSIS — Z0000 Encounter for general adult medical examination without abnormal findings: Secondary | ICD-10-CM

## 2024-03-26 NOTE — Progress Notes (Signed)
 Subjective:   Zachary Turner is a 78 y.o. who presents for a Medicare Wellness preventive visit.  As a reminder, Annual Wellness Visits don't include a physical exam, and some assessments may be limited, especially if this visit is performed virtually. We may recommend an in-person follow-up visit with your provider if needed.  Visit Complete: Virtual I connected with  Burle T Dunkerson on 03/26/24 by a audio enabled telemedicine application and verified that I am speaking with the correct person using two identifiers.  Patient Location: Home  Provider Location: Office/Clinic  I discussed the limitations of evaluation and management by telemedicine. The patient expressed understanding and agreed to proceed.  Vital Signs: Because this visit was a virtual/telehealth visit, some criteria may be missing or patient reported. Any vitals not documented were not able to be obtained and vitals that have been documented are patient reported.  VideoDeclined- This patient declined Librarian, academic. Therefore the visit was completed with audio only.  Persons Participating in Visit: Patient.  AWV Questionnaire: No: Patient Medicare AWV questionnaire was not completed prior to this visit.  Cardiac Risk Factors include: advanced age (>38men, >17 women);hypertension;male gender     Objective:     Today's Vitals   03/26/24 1359  Weight: 160 lb (72.6 kg)  Height: 6' (1.829 m)   Body mass index is 21.7 kg/m.     03/26/2024    1:58 PM 03/22/2023    1:04 PM  Advanced Directives  Does Patient Have a Medical Advance Directive? Yes Yes  Type of Estate agent of [place];Living will Healthcare Power of [place];Living will  Copy of Healthcare Power of Attorney in Chart? No - copy requested No - copy requested    Current Medications (verified) Outpatient Encounter Medications as of 03/26/2024  Medication Sig   ALPRAZolam  (XANAX ) 0.5 MG  tablet TAKE 0.5-1 TABLETS (0.25-0.5 MG TOTAL) BY MOUTH AT BEDTIME AS NEEDED FOR ANXIETY.   fluocinonide cream (LIDEX) 0.05 % Apply 1 application topically 2 (two) times daily.   ibuprofen (ADVIL,MOTRIN) 200 MG tablet Take 400 mg by mouth 2 (two) times daily as needed for pain.   MAGNESIUM CITRATE PO Take 1 tablet by mouth daily.   Multiple Vitamin (MULTIVITAMIN WITH MINERALS) TABS Take 1 tablet by mouth daily. Spectracite   nebivolol  (BYSTOLIC ) 5 MG tablet TAKE 1 TABLET (5 MG TOTAL) BY MOUTH DAILY.   TURMERIC PO Take 100 mg by mouth daily.   No facility-administered encounter medications on file as of 03/26/2024.    Allergies (verified) Patient has no known allergies.   History: Past Medical History:  Diagnosis Date   Hypertension    Past Surgical History:  Procedure Laterality Date   HERNIA REPAIR     REPLACEMENT TOTAL KNEE     TONSILLECTOMY     Family History  Problem Relation Age of Onset   Hypertension Mother    Hypertension Father    Obesity Sister    Atrial fibrillation Sister    Heart disease Brother    Obesity Sister    Social History   Socioeconomic History   Marital status: Widowed    Spouse name: Not on file   Number of children: Not on file   Years of education: Not on file   Highest education level: Not on file  Occupational History   Not on file  Tobacco Use   Smoking status: Never   Smokeless tobacco: Never  Substance and Sexual Activity   Alcohol use: Yes  Alcohol/week: 1.0 standard drink of alcohol    Types: 1 Glasses of wine per week    Comment: Socially   Drug use: No   Sexual activity: Not Currently  Other Topics Concern   Not on file  Social History Narrative   Widowed   Social Drivers of Health   Financial Resource Strain: Low Risk  (03/26/2024)   Overall Financial Resource Strain (CARDIA)    Difficulty of Paying Living Expenses: Not hard at all  Food Insecurity: No Food Insecurity (03/26/2024)   Hunger Vital Sign    Worried About  Running Out of Food in the Last Year: Never true    Ran Out of Food in the Last Year: Never true  Transportation Needs: No Transportation Needs (03/26/2024)   PRAPARE - Administrator, Civil Service (Medical): No    Lack of Transportation (Non-Medical): No  Physical Activity: Sufficiently Active (03/26/2024)   Exercise Vital Sign    Days of Exercise per Week: 5 days    Minutes of Exercise per Session: 70 min  Stress: No Stress Concern Present (03/26/2024)   [employer] of Occupational Health - Occupational Stress Questionnaire    Feeling of Stress : Not at all  Social Connections: Moderately Integrated (03/26/2024)   Social Connection and Isolation Panel [NHANES]    Frequency of Communication with Friends and Family: More than three times a week    Frequency of Social Gatherings with Friends and Family: More than three times a week    Attends Religious Services: More than 4 times per year    Active Member of [employer] or Organizations: Yes    Attends Banker Meetings: More than 4 times per year    Marital Status: Widowed    Tobacco Counseling Counseling given: No    Clinical Intake:  Pre-visit preparation completed: Yes  Pain : No/denies pain     BMI - recorded: 21.7 Nutritional Status: BMI 25 -29 Overweight Nutritional Risks: None Diabetes: No  Lab Results  Component Value Date   HGBA1C 5.5 08/14/2022     How often do you need to have someone help you when you read instructions, pamphlets, or other written materials from your doctor or pharmacy?: 1 - Never  Interpreter Needed?: No  Information entered by :: Kandy Orris, CMA   Activities of Daily Living     03/26/2024    2:02 PM  In your present state of health, do you have any difficulty performing the following activities:  Hearing? 0  Vision? 0  Difficulty concentrating or making decisions? 0  Walking or climbing stairs? 0  Dressing or bathing? 0  Doing errands, shopping? 0   Preparing Food and eating ? N  Using the Toilet? N  In the past six months, have you accidently leaked urine? N  Do you have problems with loss of bowel control? N  Managing your Medications? N  Managing your Finances? N  Housekeeping or managing your Housekeeping? N    Patient Care Team: Colene Dauphin, MD as PCP - [facility], [facility] as Consulting Physician (Ophthalmology)  Indicate any recent Medical Services you may have received from other than [facility] providers in the past year (date may be approximate).     Assessment:    This is a routine wellness examination for Zachary Turner.  Hearing/Vision screen Hearing Screening - Comments:: Denies hearing difficulties   Vision Screening - Comments:: Wears rx glasses - [employer]   Goals Addressed  This Visit's Progress     Patient Stated (pt-stated)        Patient stated he will continue to exercise and play tennis       Depression Screen     03/26/2024    2:04 PM 03/22/2023    1:05 PM 12/22/2022    3:38 PM 10/31/2022    7:54 AM 04/10/2022    2:34 PM 07/21/2020    3:44 PM 02/26/2019    9:17 AM  PHQ 2/9 Scores  PHQ - 2 Score 0 0 0 0 0 0 0  PHQ- 9 Score 0 0         Fall Risk     03/26/2024    2:07 PM 03/22/2023    1:05 PM 10/31/2022    7:54 AM 04/10/2022    2:38 PM 08/23/2021    9:36 AM  Fall Risk   Falls in the past year? 0 0 0 0 0  Number falls in past yr: 0 0 0 0 0  Injury with Fall? 0 0 0 0 0  Risk for fall due to : No Fall Risks No Fall Risks No Fall Risks No Fall Risks No Fall Risks  Follow up Falls evaluation completed;Falls prevention discussed Falls prevention discussed Falls evaluation completed Falls evaluation completed Falls evaluation completed    MEDICARE RISK AT HOME:  Medicare Risk at Home Any stairs in or around the home?: Yes If so, are there any without handrails?: No Home free of loose throw rugs in walkways, pet beds, electrical cords,  etc?: Yes Adequate lighting in your home to reduce risk of falls?: Yes Life alert?: No Use of a cane, walker or w/c?: No Grab bars in the bathroom?: Yes Shower chair or bench in shower?: Yes Elevated toilet seat or a handicapped toilet?: No  TIMED UP AND GO:  Was the test performed?  No  Cognitive Function: 6CIT completed        03/26/2024    2:08 PM 03/22/2023    1:05 PM  6CIT Screen  What Year? 0 points 0 points  What month? 0 points 0 points  What time? 0 points 0 points  Count back from 20 0 points 0 points  Months in reverse 0 points 0 points  Repeat phrase 0 points 0 points  Total Score 0 points 0 points    Immunizations Immunization History  Administered Date(s) Administered   Fluad Quad(high Dose 65+) 06/30/2019, 07/21/2020, 07/20/2022   Influenza Whole 07/12/2008, 10/15/2008, 08/12/2009, 08/09/2010   Influenza, High Dose Seasonal PF 07/26/2017, 07/22/2018, 07/15/2023   PFIZER Comirnaty(Gray Top)Covid-19 Tri-Sucrose Vaccine 01/18/2021, 07/26/2022   PFIZER(Purple Top)SARS-COV-2 Vaccination 11/14/2019, 12/05/2019, 06/28/2020   Pfizer Covid-19 Vaccine Bivalent Booster 51yrs & up 07/11/2021   Pfizer(Comirnaty)Fall Seasonal Vaccine 12 years and older 07/15/2023   Pneumococcal Conjugate-13 07/24/2014   Pneumococcal Polysaccharide-23 09/11/2011, 08/05/2020   Respiratory Syncytial Virus Vaccine,Recomb Aduvanted(Arexvy) 07/26/2022   Zoster, Live 11/11/2012    Screening Tests Health Maintenance  Topic Date Due   Hepatitis C Screening  Never done   DTaP/Tdap/Td (1 - Tdap) Never done   Zoster Vaccines- Shingrix (1 of 2) 12/24/1994   COVID-19 Vaccine (8 - 2024-25 season) 01/12/2024   INFLUENZA VACCINE  05/30/2024   Medicare Annual Wellness (AWV)  03/26/2025   Pneumonia Vaccine 70+ Years old  Completed   HPV VACCINES  Aged Out   Meningococcal B Vaccine  Aged Out    Health Maintenance  Health Maintenance Due  Topic Date Due   Hepatitis C  Screening  Never done    DTaP/Tdap/Td (1 - Tdap) Never done   Zoster Vaccines- Shingrix (1 of 2) 12/24/1994   COVID-19 Vaccine (8 - 2024-25 season) 01/12/2024   Health Maintenance Items Addressed:  Hepatitis C Screening ordered today  Additional Screening:  Vision Screening: Recommended annual ophthalmology exams for early detection of glaucoma and other disorders of the eye. Pt plans to schedule an appt w/[facility].    Dental Screening: Recommended annual dental exams for proper oral hygiene  Community Resource Referral / Chronic Care Management: CRR required this visit?  No   CCM required this visit?  No   Plan:    I have personally reviewed and noted the following in the patient's chart:   Medical and social history Use of alcohol, tobacco or illicit drugs  Current medications and supplements including opioid prescriptions. Patient is not currently taking opioid prescriptions. Functional ability and status Nutritional status Physical activity Advanced directives List of other physicians Hospitalizations, surgeries, and ER visits in previous 12 months Vitals Screenings to include cognitive, depression, and falls Referrals and appointments  In addition, I have reviewed and discussed with patient certain preventive protocols, quality metrics, and best practice recommendations. A written personalized care plan for preventive services as well as general preventive health recommendations were provided to patient.   Patria Bookbinder, CMA   03/26/2024   After Visit Summary: (MyChart) Due to this being a telephonic visit, the after visit summary with patients personalized plan was offered to patient via MyChart   Notes: Nothing significant to report at this time.

## 2024-03-26 NOTE — Patient Instructions (Addendum)
 Mr. Bocock , Thank you for taking time out of your busy schedule to complete your Annual Wellness Visit with me. I enjoyed our conversation and look forward to speaking with you again next year. I, as well as your care team,  appreciate your ongoing commitment to your health goals. Please review the following plan we discussed and let me know if I can assist you in the future. Your Game plan/ To Do List    Follow up Visits: Next Medicare AWV with our clinical staff: 03/31/2025   Have you seen your provider in the last 6 months (3 months if uncontrolled diabetes)? Yes Next Office Visit with your provider: To be scheduled  Clinician Recommendations:  Aim for 30 minutes of exercise or brisk walking, 6-8 glasses of water, and 5 servings of fruits and vegetables each day. Educated and advised on getting the COVID, Shingles, and Tdap (Tetenus) vaccines in 2025 at local pharmacy.  Patient plans to schedule an appointment w/[facility] for a routine eye exam for 2025.      This is a list of the screening recommended for you and due dates:  Health Maintenance  Topic Date Due   Hepatitis C Screening  Never done   DTaP/Tdap/Td vaccine (1 - Tdap) Never done   Zoster (Shingles) Vaccine (1 of 2) 12/24/1994   COVID-19 Vaccine (8 - 2024-25 season) 01/12/2024   Flu Shot  05/30/2024   Medicare Annual Wellness Visit  03/26/2025   Pneumonia Vaccine  Completed   HPV Vaccine  Aged Out   Meningitis B Vaccine  Aged Out    Advanced directives: (Copy Requested) Please bring a copy of your health care power of attorney and living will to the office to be added to your chart at your convenience. You can mail to [facility] [address] [place], [place] [address] or email to [email or web address] Advance Care Planning is important because it:  [x]  Makes sure you receive the medical care that is consistent with your values, goals, and preferences  [x]  It provides guidance to  your family and loved ones and reduces their decisional burden about whether or not they are making the right decisions based on your wishes.  Follow the link provided in your after visit summary or read over the paperwork we have mailed to you to help you started getting your Advance Directives in place. If you need assistance in completing these, please reach out to us  so that we can help you!

## 2024-04-01 DIAGNOSIS — M25511 Pain in right shoulder: Secondary | ICD-10-CM | POA: Diagnosis not present

## 2024-04-01 DIAGNOSIS — M25561 Pain in right knee: Secondary | ICD-10-CM | POA: Diagnosis not present

## 2024-04-08 DIAGNOSIS — M25561 Pain in right knee: Secondary | ICD-10-CM | POA: Diagnosis not present

## 2024-04-08 DIAGNOSIS — M25511 Pain in right shoulder: Secondary | ICD-10-CM | POA: Diagnosis not present

## 2024-04-15 DIAGNOSIS — M25561 Pain in right knee: Secondary | ICD-10-CM | POA: Diagnosis not present

## 2024-04-15 DIAGNOSIS — M25511 Pain in right shoulder: Secondary | ICD-10-CM | POA: Diagnosis not present

## 2024-04-22 DIAGNOSIS — M25561 Pain in right knee: Secondary | ICD-10-CM | POA: Diagnosis not present

## 2024-04-22 DIAGNOSIS — L820 Inflamed seborrheic keratosis: Secondary | ICD-10-CM | POA: Diagnosis not present

## 2024-04-22 DIAGNOSIS — M25511 Pain in right shoulder: Secondary | ICD-10-CM | POA: Diagnosis not present

## 2024-04-29 DIAGNOSIS — M25561 Pain in right knee: Secondary | ICD-10-CM | POA: Diagnosis not present

## 2024-04-29 DIAGNOSIS — M25511 Pain in right shoulder: Secondary | ICD-10-CM | POA: Diagnosis not present

## 2024-05-06 DIAGNOSIS — M25561 Pain in right knee: Secondary | ICD-10-CM | POA: Diagnosis not present

## 2024-05-06 DIAGNOSIS — M25511 Pain in right shoulder: Secondary | ICD-10-CM | POA: Diagnosis not present

## 2024-05-13 DIAGNOSIS — M25511 Pain in right shoulder: Secondary | ICD-10-CM | POA: Diagnosis not present

## 2024-05-13 DIAGNOSIS — M25561 Pain in right knee: Secondary | ICD-10-CM | POA: Diagnosis not present

## 2024-05-20 DIAGNOSIS — M25511 Pain in right shoulder: Secondary | ICD-10-CM | POA: Diagnosis not present

## 2024-05-20 DIAGNOSIS — M25561 Pain in right knee: Secondary | ICD-10-CM | POA: Diagnosis not present

## 2024-05-27 DIAGNOSIS — M25511 Pain in right shoulder: Secondary | ICD-10-CM | POA: Diagnosis not present

## 2024-05-27 DIAGNOSIS — M25561 Pain in right knee: Secondary | ICD-10-CM | POA: Diagnosis not present

## 2024-06-26 DIAGNOSIS — B079 Viral wart, unspecified: Secondary | ICD-10-CM | POA: Diagnosis not present

## 2024-06-26 DIAGNOSIS — B370 Candidal stomatitis: Secondary | ICD-10-CM | POA: Diagnosis not present

## 2024-07-31 DIAGNOSIS — L821 Other seborrheic keratosis: Secondary | ICD-10-CM | POA: Diagnosis not present

## 2024-07-31 DIAGNOSIS — L820 Inflamed seborrheic keratosis: Secondary | ICD-10-CM | POA: Diagnosis not present

## 2024-07-31 DIAGNOSIS — L812 Freckles: Secondary | ICD-10-CM | POA: Diagnosis not present

## 2024-07-31 DIAGNOSIS — L570 Actinic keratosis: Secondary | ICD-10-CM | POA: Diagnosis not present

## 2024-07-31 DIAGNOSIS — Z85828 Personal history of other malignant neoplasm of skin: Secondary | ICD-10-CM | POA: Diagnosis not present

## 2024-08-02 ENCOUNTER — Other Ambulatory Visit: Payer: Self-pay | Admitting: Internal Medicine

## 2024-08-04 ENCOUNTER — Other Ambulatory Visit: Payer: Self-pay

## 2024-08-04 MED ORDER — NEBIVOLOL HCL 5 MG PO TABS: 5.0000 mg | ORAL_TABLET | Freq: Every day | ORAL | 1 refills | Status: DC

## 2024-11-02 ENCOUNTER — Other Ambulatory Visit: Payer: Self-pay | Admitting: Internal Medicine

## 2024-11-17 ENCOUNTER — Encounter: Payer: Self-pay | Admitting: Internal Medicine

## 2024-11-17 NOTE — Patient Instructions (Addendum)
 "     Blood work was ordered.       Medications changes include :   None    A referral was ordered and someone will call you to schedule an appointment.     Return in about 1 year (around 11/18/2025) for Physical Exam.   Health Maintenance, Male Adopting a healthy lifestyle and getting preventive care are important in promoting health and wellness. Ask your health care provider about: The right schedule for you to have regular tests and exams. Things you can do on your own to prevent diseases and keep yourself healthy. What should I know about diet, weight, and exercise? Eat a healthy diet  Eat a diet that includes plenty of vegetables, fruits, low-fat dairy products, and lean protein. Do not eat a lot of foods that are high in solid fats, added sugars, or sodium. Maintain a healthy weight Body mass index (BMI) is a measurement that can be used to identify possible weight problems. It estimates body fat based on height and weight. Your health care provider can help determine your BMI and help you achieve or maintain a healthy weight. Get regular exercise Get regular exercise. This is one of the most important things you can do for your health. Most adults should: Exercise for at least 150 minutes each week. The exercise should increase your heart rate and make you sweat (moderate-intensity exercise). Do strengthening exercises at least twice a week. This is in addition to the moderate-intensity exercise. Spend less time sitting. Even light physical activity can be beneficial. Watch cholesterol and blood lipids Have your blood tested for lipids and cholesterol at 80 years of age, then have this test every 5 years. You may need to have your cholesterol levels checked more often if: Your lipid or cholesterol levels are high. You are older than 80 years of age. You are at high risk for heart disease. What should I know about cancer screening? Many types of cancers can be detected  early and may often be prevented. Depending on your health history and family history, you may need to have cancer screening at various ages. This may include screening for: Colorectal cancer. Prostate cancer. Skin cancer. Lung cancer. What should I know about heart disease, diabetes, and high blood pressure? Blood pressure and heart disease High blood pressure causes heart disease and increases the risk of stroke. This is more likely to develop in people who have high blood pressure readings or are overweight. Talk with your health care provider about your target blood pressure readings. Have your blood pressure checked: Every 3-5 years if you are 36-74 years of age. Every year if you are 43 years old or older. If you are between the ages of 104 and 49 and are a current or former smoker, ask your health care provider if you should have a one-time screening for abdominal aortic aneurysm (AAA). Diabetes Have regular diabetes screenings. This checks your fasting blood sugar level. Have the screening done: Once every three years after age 7 if you are at a normal weight and have a low risk for diabetes. More often and at a younger age if you are overweight or have a high risk for diabetes. What should I know about preventing infection? Hepatitis B If you have a higher risk for hepatitis B, you should be screened for this virus. Talk with your health care provider to find out if you are at risk for hepatitis B infection. Hepatitis C Blood testing is recommended  for: Everyone born from [birth year] through 1966. Anyone with known risk factors for hepatitis C. Sexually transmitted infections (STIs) You should be screened each year for STIs, including gonorrhea and chlamydia, if: You are sexually active and are younger than 79 years of age. You are older than 80 years of age and your health care provider tells you that you are at risk for this type of infection. Your sexual activity has changed since  you were last screened, and you are at increased risk for chlamydia or gonorrhea. Ask your health care provider if you are at risk. Ask your health care provider about whether you are at high risk for HIV. Your health care provider may recommend a prescription medicine to help prevent HIV infection. If you choose to take medicine to prevent HIV, you should first get tested for HIV. You should then be tested every 3 months for as long as you are taking the medicine. Follow these instructions at home: Alcohol use Do not drink alcohol if your health care provider tells you not to drink. If you drink alcohol: Limit how much you have to 0-2 drinks a day. Know how much alcohol is in your drink. In the [place] one drink equals one 12 oz bottle of beer (355 mL), one 5 oz glass of wine (148 mL), or one 1 oz glass of hard liquor (44 mL). Lifestyle Do not use any products that contain nicotine or tobacco. These products include cigarettes, chewing tobacco, and vaping devices, such as e-cigarettes. If you need help quitting, ask your health care provider. Do not use street drugs. Do not share needles. Ask your health care provider for help if you need support or information about quitting drugs. General instructions Schedule regular health, dental, and eye exams. Stay current with your vaccines. Tell your health care provider if: You often feel depressed. You have ever been abused or do not feel safe at home. Summary Adopting a healthy lifestyle and getting preventive care are important in promoting health and wellness. Follow your health care provider's instructions about healthy diet, exercising, and getting tested or screened for diseases. Follow your health care provider's instructions on monitoring your cholesterol and blood pressure. This information is not intended to replace advice given to you by your health care provider. Make sure you discuss any questions you have with your health care  provider. Document Revised: 03/07/2021 Document Reviewed: 03/07/2021 Elsevier Patient Education  2024 [employer]. "
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
## 2024-11-18 ENCOUNTER — Ambulatory Visit (INDEPENDENT_AMBULATORY_CARE_PROVIDER_SITE_OTHER): Admitting: Internal Medicine

## 2024-11-18 VITALS — BP 138/80 | HR 68 | Temp 98.1°F | Ht 72.0 in | Wt 160.0 lb

## 2024-11-18 DIAGNOSIS — R739 Hyperglycemia, unspecified: Secondary | ICD-10-CM

## 2024-11-18 DIAGNOSIS — Z0000 Encounter for general adult medical examination without abnormal findings: Secondary | ICD-10-CM | POA: Diagnosis not present

## 2024-11-18 DIAGNOSIS — M25511 Pain in right shoulder: Secondary | ICD-10-CM | POA: Diagnosis not present

## 2024-11-18 DIAGNOSIS — G479 Sleep disorder, unspecified: Secondary | ICD-10-CM | POA: Diagnosis not present

## 2024-11-18 DIAGNOSIS — G8929 Other chronic pain: Secondary | ICD-10-CM

## 2024-11-18 DIAGNOSIS — I10 Essential (primary) hypertension: Secondary | ICD-10-CM | POA: Diagnosis not present

## 2024-11-18 LAB — CBC
HCT: 38.3 % — ABNORMAL LOW (ref 39.0–52.0)
Hemoglobin: 13.3 g/dL (ref 13.0–17.0)
MCHC: 34.7 g/dL (ref 30.0–36.0)
MCV: 91.9 fl (ref 78.0–100.0)
Platelets: 249 K/uL (ref 150.0–400.0)
RBC: 4.17 Mil/uL — ABNORMAL LOW (ref 4.22–5.81)
RDW: 12.7 % (ref 11.5–15.5)
WBC: 4.3 K/uL (ref 4.0–10.5)

## 2024-11-18 LAB — COMPREHENSIVE METABOLIC PANEL WITH GFR
ALT: 17 U/L (ref 3–53)
AST: 25 U/L (ref 5–37)
Albumin: 4.3 g/dL (ref 3.5–5.2)
Alkaline Phosphatase: 73 U/L (ref 39–117)
BUN: 12 mg/dL (ref 6–23)
CO2: 31 meq/L (ref 19–32)
Calcium: 9.1 mg/dL (ref 8.4–10.5)
Chloride: 106 meq/L (ref 96–112)
Creatinine, Ser: 1.16 mg/dL (ref 0.40–1.50)
GFR: 59.74 mL/min — ABNORMAL LOW
Glucose, Bld: 91 mg/dL (ref 70–99)
Potassium: 4.4 meq/L (ref 3.5–5.1)
Sodium: 141 meq/L (ref 135–145)
Total Bilirubin: 0.5 mg/dL (ref 0.2–1.2)
Total Protein: 6.8 g/dL (ref 6.0–8.3)

## 2024-11-18 LAB — LIPID PANEL
Cholesterol: 169 mg/dL (ref 28–200)
HDL: 51.7 mg/dL
LDL Cholesterol: 105 mg/dL — ABNORMAL HIGH (ref 10–99)
NonHDL: 117.6
Total CHOL/HDL Ratio: 3
Triglycerides: 63 mg/dL (ref 10.0–149.0)
VLDL: 12.6 mg/dL (ref 0.0–40.0)

## 2024-11-18 LAB — TSH: TSH: 4.2 u[IU]/mL (ref 0.35–5.50)

## 2024-11-18 LAB — HEMOGLOBIN A1C: Hgb A1c MFr Bld: 5.2 % (ref 4.6–6.5)

## 2024-11-18 MED ORDER — NEBIVOLOL HCL 5 MG PO TABS: 5.0000 mg | ORAL_TABLET | Freq: Every day | ORAL | 3 refills | Status: AC

## 2024-11-18 NOTE — Assessment & Plan Note (Signed)
 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

## 2024-11-18 NOTE — Assessment & Plan Note (Signed)
 Chronic Lab Results  Component Value Date   HGBA1C 5.5 08/14/2022   Check a1c Low sugar / carb diet Stressed regular exercise

## 2024-11-18 NOTE — Assessment & Plan Note (Signed)
 Chronic Controlled, stable Continue xanax  0.25 mg HS prn-taking infrequently

## 2024-11-18 NOTE — Assessment & Plan Note (Addendum)
 Chronic BP well controlled - in 120-130's at home Continue bystolic  5 mg daily CBC, CMP, TSH, lipid panel EKG today

## 2024-11-20 ENCOUNTER — Ambulatory Visit: Payer: Self-pay | Admitting: Internal Medicine

## 2025-03-31 ENCOUNTER — Ambulatory Visit
